# Patient Record
Sex: Male | Born: 1940
Health system: Southern US, Community
[De-identification: ages and names within clinical notes are randomized; demographics above are authoritative.]

## PROBLEM LIST (undated history)

## (undated) DIAGNOSIS — I10 Essential (primary) hypertension: Secondary | ICD-10-CM

## (undated) HISTORY — DX: Essential (primary) hypertension: I10

## (undated) HISTORY — PX: ROTATOR CUFF REPAIR: SHX139

## (undated) HISTORY — PX: CYST EXCISION: SHX5701

## (undated) HISTORY — PX: DENTAL SURGERY: SHX609

## (undated) HISTORY — PX: UMBILICAL HERNIA REPAIR: SHX196

---

## 2009-02-03 ENCOUNTER — Encounter: Admission: RE | Admit: 2009-02-03 | Discharge: 2009-02-03 | Payer: Self-pay | Admitting: Family Medicine

## 2010-01-20 IMAGING — US US AORTA SCREENING (MEDICARE)
1 series · 14 of 22 positions shown · non-contrast
Comparison: None

CLINICAL DATA: Family history of abdominal aortic aneurysm

ABDOMINAL AORTA SCREENING ULTRASOUND
TECHNIQUE: Ultrasound examination of the abdominal aorta was
performed as a screening evaluation for abdominal aortic aneurysm.
The proximal iliac arteries were also evaluated bilaterally.

[Series 1: us aorta screening (medicare) · 14 of 22 slices shown]
[im 1/22]
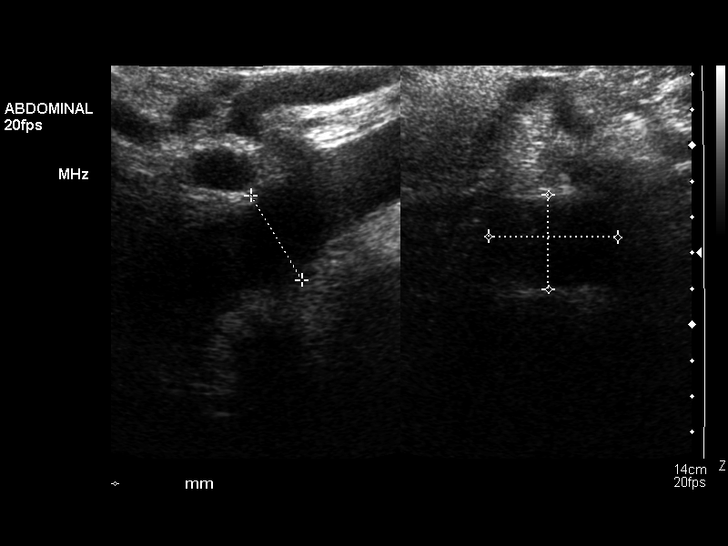
[im 3/22]
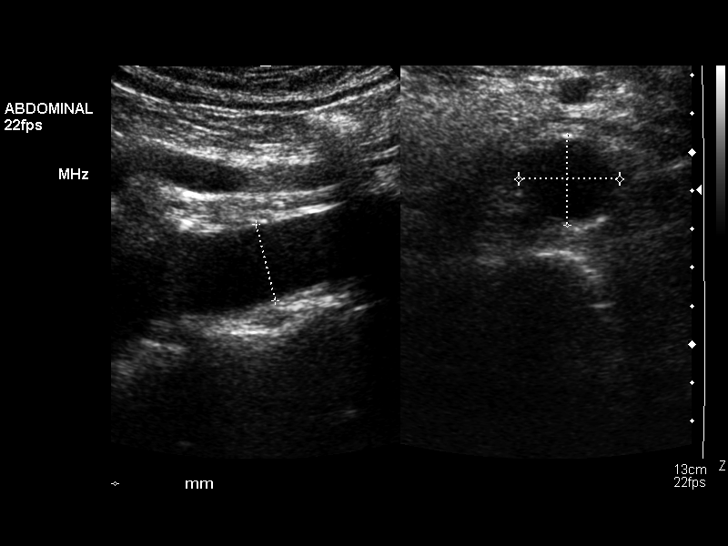
[im 4/22]
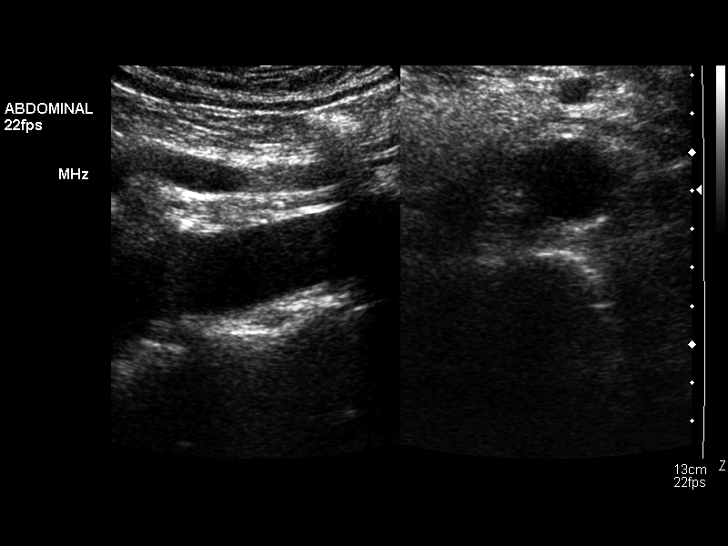
[im 6/22]
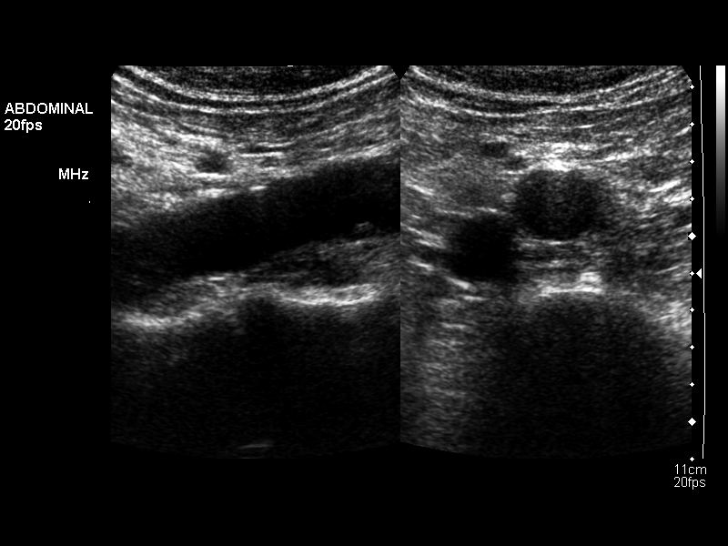
[im 8/22]
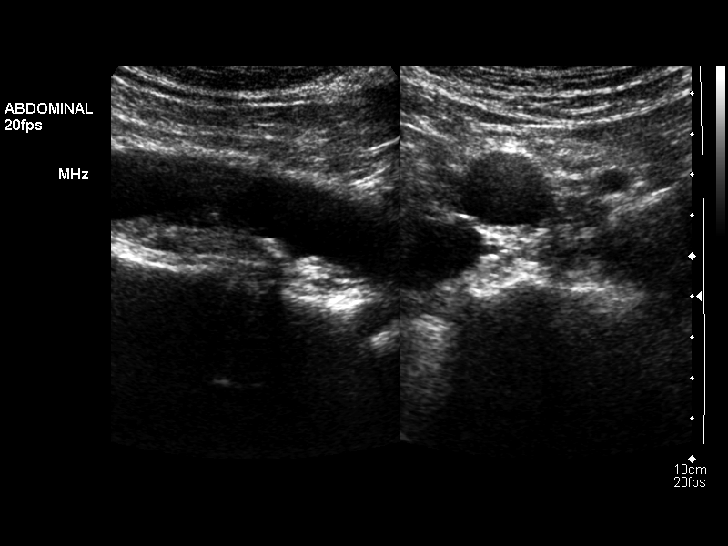
[im 9/22]
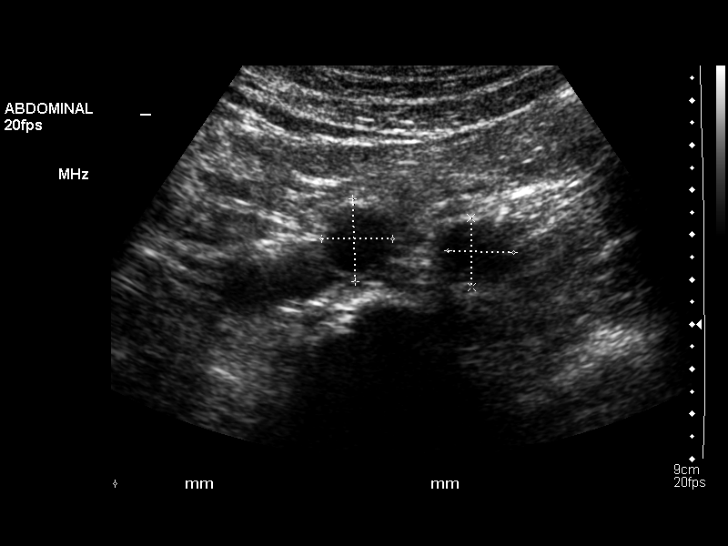
[im 11/22]
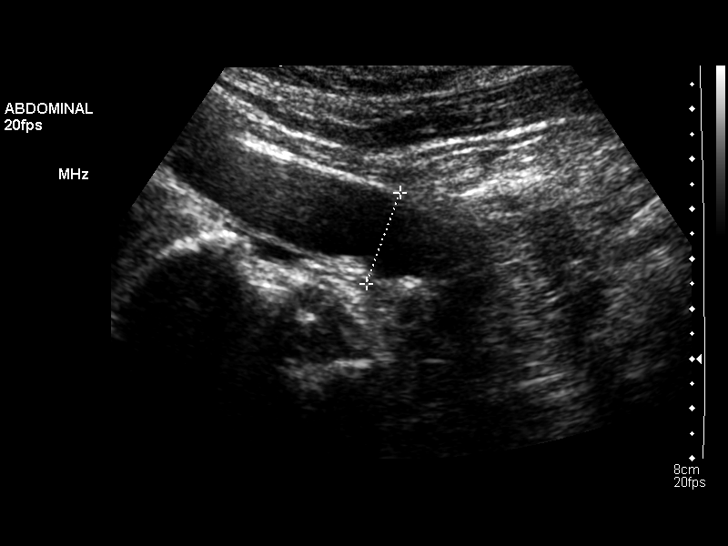
[im 12/22]
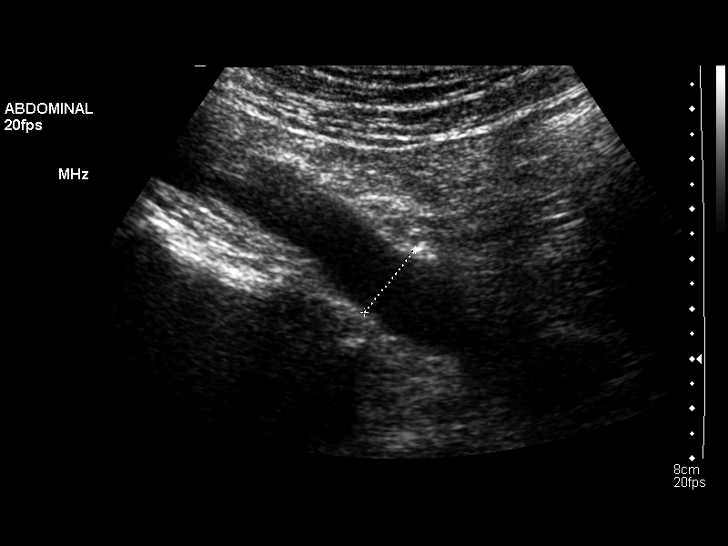
[im 14/22]
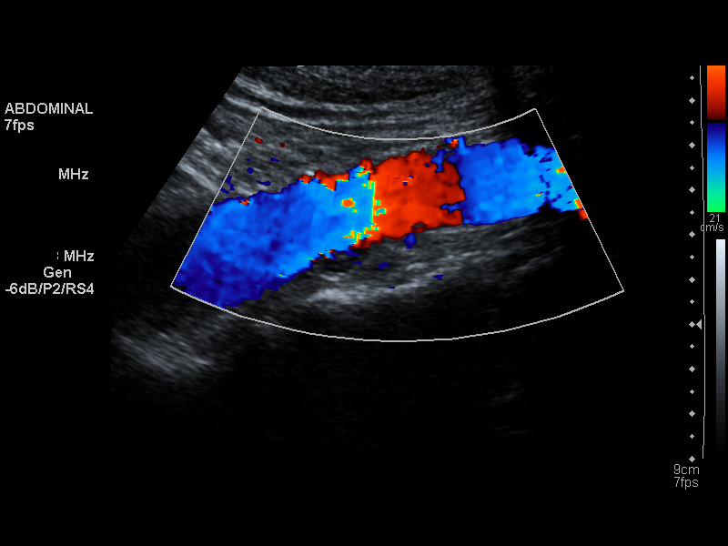
[im 15/22]
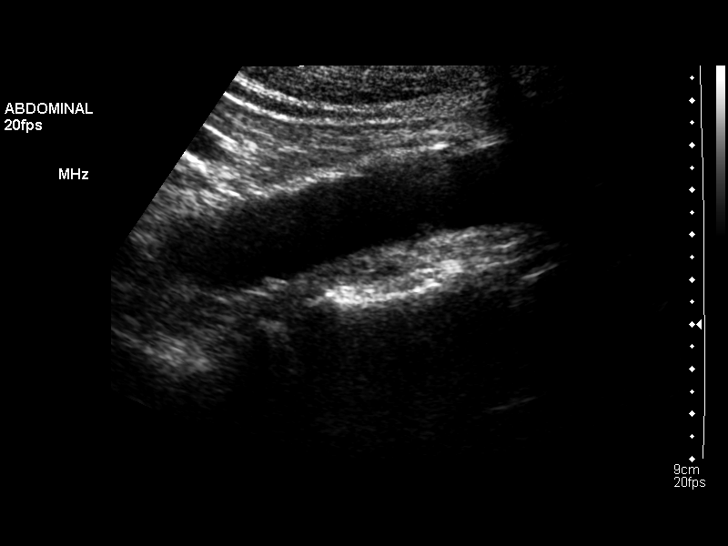
[im 17/22]
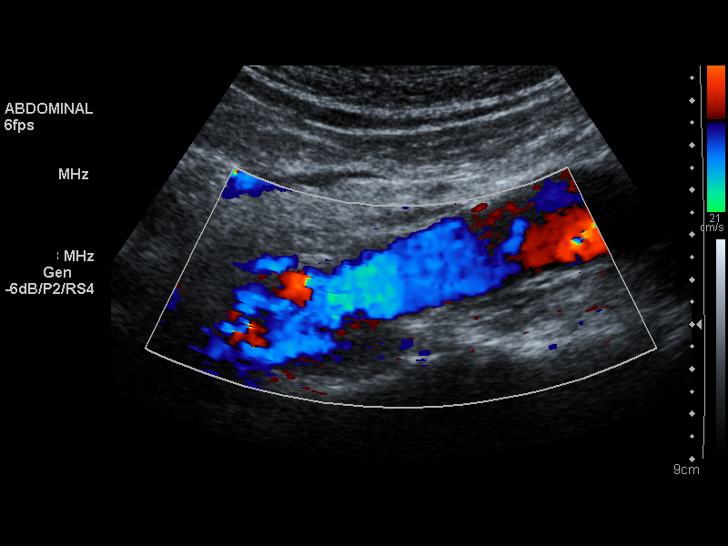
[im 19/22]
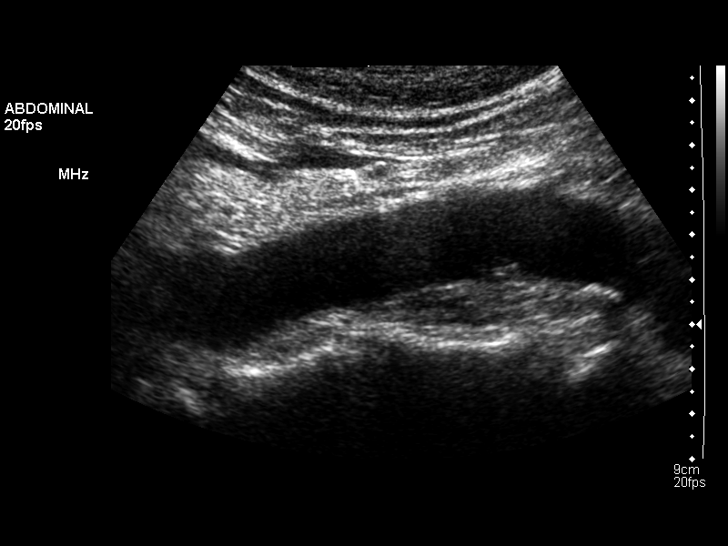
[im 20/22]
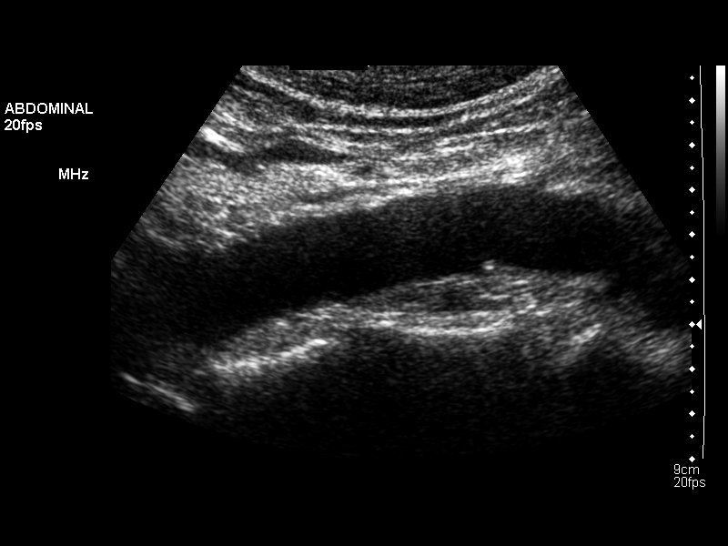
[im 22/22]
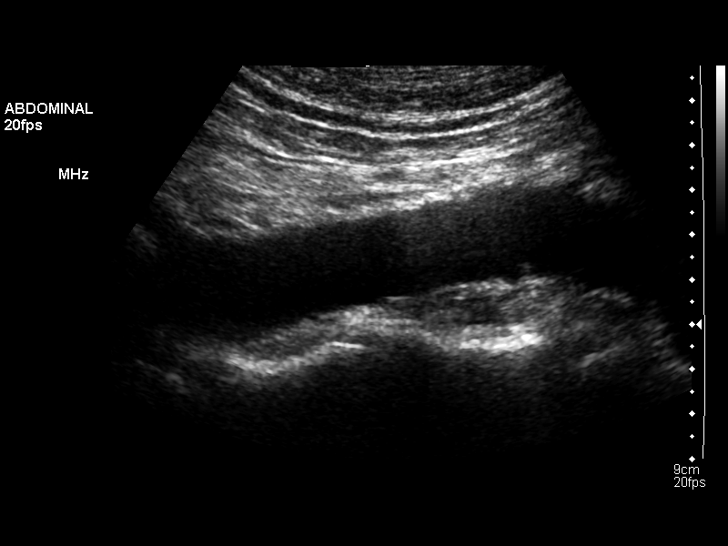

[14 of 22 positions shown; findings below may reference images not displayed]

FINDINGS: No definite focal aneurysm is seen.  The abdominal aorta
is fusiformly prominent.  Proximally the maximum diameter is 3.6 x
2.9 cm, but in the midportion the abdominal aorta measures 2.6 x
2.3 cm.  There are atherosclerotic changes with some irregularity
of the contour of the abdominal aorta.  The common iliac arteries
are prominent as well with the right measuring 23 mm proximally and
the left measuring 16 mm.

IVC is visualized.  No hydronephrosis is seen.
IMPRESSION: No definite aneurysm although the abdominal aorta is fusiformly
ectatic with atherosclerotic change.  The proximal common iliac
arteries are prominent, right greater than left.

## 2013-10-07 ENCOUNTER — Encounter: Payer: Self-pay | Admitting: Gastroenterology

## 2014-09-02 ENCOUNTER — Encounter: Payer: Self-pay | Admitting: Gastroenterology

## 2015-01-05 ENCOUNTER — Encounter: Payer: Self-pay | Admitting: Gastroenterology

## 2015-02-27 ENCOUNTER — Telehealth: Payer: Self-pay | Admitting: Internal Medicine

## 2015-02-27 NOTE — Telephone Encounter (Signed)
A user error has taken place.

## 2015-03-28 ENCOUNTER — Encounter: Payer: Self-pay | Admitting: Family Medicine

## 2017-12-07 ENCOUNTER — Encounter (HOSPITAL_COMMUNITY): Payer: Self-pay | Admitting: *Deleted

## 2017-12-07 ENCOUNTER — Emergency Department (HOSPITAL_COMMUNITY)
Admission: EM | Admit: 2017-12-07 | Discharge: 2017-12-07 | Disposition: A | Discharge status: Home / Self Care | Payer: Medicare Other | Attending: Emergency Medicine | Admitting: Emergency Medicine

## 2017-12-07 ENCOUNTER — Other Ambulatory Visit: Payer: Self-pay

## 2017-12-07 DIAGNOSIS — S40862A Insect bite (nonvenomous) of left upper arm, initial encounter: Secondary | ICD-10-CM | POA: Insufficient documentation

## 2017-12-07 DIAGNOSIS — W57XXXA Bitten or stung by nonvenomous insect and other nonvenomous arthropods, initial encounter: Secondary | ICD-10-CM | POA: Diagnosis not present

## 2017-12-07 DIAGNOSIS — Y939 Activity, unspecified: Secondary | ICD-10-CM | POA: Diagnosis not present

## 2017-12-07 DIAGNOSIS — Y998 Other external cause status: Secondary | ICD-10-CM | POA: Diagnosis not present

## 2017-12-07 DIAGNOSIS — Y929 Unspecified place or not applicable: Secondary | ICD-10-CM | POA: Insufficient documentation

## 2017-12-07 DIAGNOSIS — Z5321 Procedure and treatment not carried out due to patient leaving prior to being seen by health care provider: Secondary | ICD-10-CM | POA: Insufficient documentation

## 2017-12-07 NOTE — ED Triage Notes (Signed)
Pt not in [facility] when called

## 2017-12-07 NOTE — ED Triage Notes (Signed)
Pt c/o tick bite to left axilla about a week ago. Pt reports he didn't remove the tick until 2 days ago. Redness around tick bite. Denies fever.

## 2017-12-08 NOTE — ED Notes (Signed)
Follow up call made no answer  12/08/17  1127  s Elisah Parmer rn

## 2019-02-02 DIAGNOSIS — I10 Essential (primary) hypertension: Secondary | ICD-10-CM | POA: Diagnosis not present

## 2019-02-02 DIAGNOSIS — E782 Mixed hyperlipidemia: Secondary | ICD-10-CM | POA: Diagnosis not present

## 2019-02-04 DIAGNOSIS — M19042 Primary osteoarthritis, left hand: Secondary | ICD-10-CM | POA: Diagnosis not present

## 2019-02-04 DIAGNOSIS — Z86010 Personal history of colonic polyps: Secondary | ICD-10-CM | POA: Diagnosis not present

## 2019-02-04 DIAGNOSIS — I10 Essential (primary) hypertension: Secondary | ICD-10-CM | POA: Diagnosis not present

## 2019-02-04 DIAGNOSIS — E782 Mixed hyperlipidemia: Secondary | ICD-10-CM | POA: Diagnosis not present

## 2019-02-11 ENCOUNTER — Other Ambulatory Visit: Payer: Self-pay | Admitting: Internal Medicine

## 2019-02-11 DIAGNOSIS — Z20822 Contact with and (suspected) exposure to covid-19: Secondary | ICD-10-CM

## 2019-02-13 LAB — NOVEL CORONAVIRUS, NAA: SARS-CoV-2, NAA: NOT DETECTED

## 2019-03-30 DIAGNOSIS — R42 Dizziness and giddiness: Secondary | ICD-10-CM | POA: Diagnosis not present

## 2019-04-02 ENCOUNTER — Other Ambulatory Visit: Payer: Self-pay

## 2019-04-02 DIAGNOSIS — Z20822 Contact with and (suspected) exposure to covid-19: Secondary | ICD-10-CM

## 2019-04-03 LAB — NOVEL CORONAVIRUS, NAA: SARS-CoV-2, NAA: NOT DETECTED

## 2019-04-06 DIAGNOSIS — R011 Cardiac murmur, unspecified: Secondary | ICD-10-CM | POA: Diagnosis not present

## 2019-04-06 DIAGNOSIS — R42 Dizziness and giddiness: Secondary | ICD-10-CM | POA: Diagnosis not present

## 2019-05-06 ENCOUNTER — Encounter: Payer: Self-pay | Admitting: Cardiology

## 2019-05-06 ENCOUNTER — Other Ambulatory Visit: Payer: Self-pay

## 2019-05-06 ENCOUNTER — Ambulatory Visit: Payer: Medicare Other | Admitting: Cardiology

## 2019-05-06 VITALS — BP 136/82 | HR 71 | Ht 69.0 in | Wt 181.6 lb

## 2019-05-06 DIAGNOSIS — I10 Essential (primary) hypertension: Secondary | ICD-10-CM

## 2019-05-06 DIAGNOSIS — R011 Cardiac murmur, unspecified: Secondary | ICD-10-CM | POA: Diagnosis not present

## 2019-05-06 DIAGNOSIS — Z7189 Other specified counseling: Secondary | ICD-10-CM

## 2019-05-06 NOTE — Patient Instructions (Signed)
Medication Instructions:  Your Physician recommend you continue on your current medication as directed.    *If you need a refill on your cardiac medications before your next appointment, please call your pharmacy*  Lab Work: None  Testing/Procedures: Your physician has requested that you have an echocardiogram. Echocardiography is a painless test that uses sound waves to create images of your heart. It provides your doctor with information about the size and shape of your heart and how well your heart's chambers and valves are working. This procedure takes approximately one hour. There are no restrictions for this procedure. [place] [address]   Follow-Up: At [facility], you and your health needs are our priority.  As part of our continuing mission to provide you with exceptional heart care, we have created designated Provider Care Teams.  These Care Teams include your primary Cardiologist (physician) and Advanced Practice Providers (APPs -  Physician Assistants and Nurse Practitioners) who all work together to provide you with the care you need, when you need it.  Your next appointment:   12 months  The format for your next appointment:   In Person  Provider:   Buford Dresser, MD

## 2019-05-06 NOTE — Progress Notes (Signed)
Cardiology Office Note:    Date:  05/06/2019   ID:  Dean Kaufman, DOB 05-30-1941, MRN [id number]  PCP:  Celene Squibb, MD  Cardiologist:  Buford Dresser, MD  Referring MD: Celene Squibb, MD   CC: new patient evaluation for heart murmur.  History of Present Illness:    Dean Kaufman is a 78 y.o. male with a hx of hypertension who is seen as a new consult at the request of Celene Squibb, MD for the evaluation and management of heart murmur. Unfortunately the records for the referral were unable to be downloaded in our system, so no records available.  Patient history: Reports Echo was done 9-10 years ago, saw a cardiologist over 10 years ago on [place] but none since. Was told nothing to worry about and to come back in 10 years.  Cardiovascular risk factors: Prior clinical ASCVD: none Comorbid conditions: hypertension (>10 years) on losartan. Denies hyperlipidemia, diabetes, chronic kidney disease:  Metabolic syndrome/Obesity: no, BMI 26 Chronic inflammatory conditions: none Tobacco use history: former, quit 1972. Smoked briefly in high school and in the [employer] Alcohol: one glass of wine every other day Family history: mother had CHF for about 2 years before she died, deceased age 22. Father had aneurysm vs. MI, died age 59, was a smoker. Mat Gma deceased age 84, unknown. Mat Gpa died age 23, MI. Teena Irani died age 58 of Hodgkins. Gena Fray died age 73, unknown cause. Two sisters, one deceased age 15 from colon cancer, other sister almost 66 with terminal lung cancer. Children are healthy. Prior cardiac testing and/or incidental findings on other testing (ie coronary calcium): none. AAA Exercise level: very active. Walks 3 miles/day every day without issue. Current diet: lost 30 lbs intentionally about a year ago, tries to eat healthy  Denies chest pain, shortness of breath at rest or with normal exertion. No PND, orthopnea, LE edema or unexpected weight gain. No syncope or  palpitations.  Takes BP at home, notes that occasionally there is a pause between beats. Had PVCs on ECG today. 130-135/70 BP at home when he takes losartan, if he rarely missed a dose BP is more 150s/90s.  Past Medical History:  Diagnosis Date  . Hypertension     Past Surgical History:  Procedure Laterality Date  . CYST EXCISION    . DENTAL SURGERY    . ROTATOR CUFF REPAIR Left   . UMBILICAL HERNIA REPAIR      Current Medications: Current Outpatient Medications on File Prior to Visit  Medication Sig  . losartan (COZAAR) 25 MG tablet Take 1 tablet by mouth daily.  . Omega-3 Fatty Acids (FISH OIL PO) Take 1 tablet by mouth daily.  . Turmeric (QC TUMERIC COMPLEX PO) Take 1 tablet by mouth daily.   No current facility-administered medications on file prior to visit.      Allergies:   Patient has no known allergies.   Social History   Tobacco Use  . Smoking status: Former Smoker    Types: Cigarettes    Quit date: 07/01/1970    Years since quitting: 48.8  . Smokeless tobacco: Never Used  Substance Use Topics  . Alcohol use: Yes    Frequency: Never    Comment: "every now and then"  . Drug use: Never    Family History: family history is not on file.  ROS:   Please see the history of present illness.  Additional pertinent ROS: Constitutional: Negative for chills, fever,  night sweats, unintentional weight loss  HENT: Negative for ear pain and hearing loss.   Eyes: Negative for loss of vision and eye pain.  Respiratory: Negative for cough, sputum, wheezing.   Cardiovascular: See HPI. Gastrointestinal: Negative for abdominal pain, melena, and hematochezia.  Genitourinary: Negative for dysuria and hematuria.  Musculoskeletal: Negative for falls and myalgias.  Skin: Negative for itching and rash.  Neurological: Negative for focal weakness, focal sensory changes and loss of consciousness.  Endo/Heme/Allergies: Does not bruise/bleed easily.     EKGs/Labs/Other Studies  Reviewed:    The following studies were reviewed today: None available  EKG:  EKG is personally reviewed.  The ekg ordered today demonstrates sinus rhythm with first degree AV block, occasional PVCs  Recent Labs: No results found for requested labs within last 8760 hours.  Recent Lipid Panel No results found for: CHOL, TRIG, HDL, CHOLHDL, VLDL, LDLCALC, LDLDIRECT  Physical Exam:    VS:  BP 136/82   Pulse 71   Ht 5\' 9"  (1.753 m)   Wt 181 lb 9.6 oz (82.4 kg)   SpO2 98%   BMI 26.82 kg/m     Wt Readings from Last 3 Encounters:  05/06/19 181 lb 9.6 oz (82.4 kg)  12/07/17 170 lb (77.1 kg)    GEN: Well nourished, well developed in no acute distress HEENT: Normal, moist mucous membranes NECK: No JVD CARDIAC: regular rhythm, normal S1 and S2, no rubs or gallops. Harsh late systolic 3/6 murmur at LLSB and apex. No click appreciated. VASCULAR: Radial and DP pulses 2+ bilaterally. No carotid bruits RESPIRATORY:  Clear to auscultation without rales, wheezing or rhonchi  ABDOMEN: Soft, non-tender, non-distended MUSCULOSKELETAL:  Ambulates independently SKIN: Warm and dry, no edema NEUROLOGIC:  Alert and oriented x 3. No focal neuro deficits noted. PSYCHIATRIC:  Normal affect    ASSESSMENT:    1. Murmur   2. Essential hypertension   3. Cardiac risk counseling   4. Counseling on health promotion and disease prevention    PLAN:    Murmur: no click appreciated, but location, late systolic timing sounds consistent with mitral valve prolapse. -given frequent PVCs, will assess to determine if this is bileaflet prolapse -if bileaflet prolapse present, would get monitor to determine PVC burden/exclude NSVT for risk of SCD.  -if unclear risk, will consider cardiac MRI  Hypertension: near goal today, on long term losartan. Continue.  Cardiac risk counseling and prevention recommendations: -recommend heart healthy/Mediterranean diet, with whole grains, fruits, vegetable, fish, lean  meats, nuts, and olive oil. Limit salt. -recommend moderate walking, 3-5 times/week for 30-50 minutes each session. Aim for at least 150 minutes.week. Goal should be pace of 3 miles/hours, or walking 1.5 miles in 30 minutes -recommend avoidance of tobacco products. Avoid excess alcohol.  Plan for follow up: 12 mos or sooner based on results of echo.  Medication Adjustments/Labs and Tests Ordered: Current medicines are reviewed at length with the patient today.  Concerns regarding medicines are outlined above.  Orders Placed This Encounter  Procedures  . EKG 12-Lead  . ECHOCARDIOGRAM COMPLETE   No orders of the defined types were placed in this encounter.   Patient Instructions  Medication Instructions:  Your Physician recommend you continue on your current medication as directed.    *If you need a refill on your cardiac medications before your next appointment, please call your pharmacy*  Lab Work: None  Testing/Procedures: Your physician has requested that you have an echocardiogram. Echocardiography is a painless test that uses sound  waves to create images of your heart. It provides your doctor with information about the size and shape of your heart and how well your heart's chambers and valves are working. This procedure takes approximately one hour. There are no restrictions for this procedure. [place] [address]   Follow-Up: At [facility], you and your health needs are our priority.  As part of our continuing mission to provide you with exceptional heart care, we have created designated Provider Care Teams.  These Care Teams include your primary Cardiologist (physician) and Advanced Practice Providers (APPs -  Physician Assistants and Nurse Practitioners) who all work together to provide you with the care you need, when you need it.  Your next appointment:   12 months  The format for your next appointment:   In Person  Provider:   Buford Dresser,  MD     Signed, Buford Dresser, MD PhD 05/06/2019 11:31 PM    [place]

## 2019-05-18 ENCOUNTER — Other Ambulatory Visit (HOSPITAL_COMMUNITY): Payer: Medicare Other

## 2019-05-24 ENCOUNTER — Ambulatory Visit (HOSPITAL_COMMUNITY): Payer: Medicare Other | Attending: Cardiovascular Disease

## 2019-05-24 ENCOUNTER — Other Ambulatory Visit: Payer: Self-pay

## 2019-05-24 DIAGNOSIS — R011 Cardiac murmur, unspecified: Secondary | ICD-10-CM | POA: Insufficient documentation

## 2019-07-17 ENCOUNTER — Encounter (HOSPITAL_COMMUNITY): Payer: Self-pay | Admitting: *Deleted

## 2019-07-17 ENCOUNTER — Emergency Department (HOSPITAL_COMMUNITY)
Admission: EM | Admit: 2019-07-17 | Discharge: 2019-07-17 | Disposition: A | Discharge status: Home / Self Care | Payer: Medicare Other | Attending: Emergency Medicine | Admitting: Emergency Medicine

## 2019-07-17 ENCOUNTER — Other Ambulatory Visit: Payer: Self-pay

## 2019-07-17 ENCOUNTER — Emergency Department (HOSPITAL_COMMUNITY): Payer: Medicare Other

## 2019-07-17 DIAGNOSIS — Z79899 Other long term (current) drug therapy: Secondary | ICD-10-CM | POA: Insufficient documentation

## 2019-07-17 DIAGNOSIS — I10 Essential (primary) hypertension: Secondary | ICD-10-CM | POA: Diagnosis not present

## 2019-07-17 DIAGNOSIS — R079 Chest pain, unspecified: Secondary | ICD-10-CM | POA: Diagnosis not present

## 2019-07-17 DIAGNOSIS — Z87891 Personal history of nicotine dependence: Secondary | ICD-10-CM | POA: Insufficient documentation

## 2019-07-17 DIAGNOSIS — Y92009 Unspecified place in unspecified non-institutional (private) residence as the place of occurrence of the external cause: Secondary | ICD-10-CM | POA: Insufficient documentation

## 2019-07-17 DIAGNOSIS — W08XXXA Fall from other furniture, initial encounter: Secondary | ICD-10-CM | POA: Diagnosis not present

## 2019-07-17 DIAGNOSIS — Y999 Unspecified external cause status: Secondary | ICD-10-CM | POA: Diagnosis not present

## 2019-07-17 DIAGNOSIS — S299XXA Unspecified injury of thorax, initial encounter: Secondary | ICD-10-CM | POA: Diagnosis present

## 2019-07-17 DIAGNOSIS — Y93E9 Activity, other interior property and clothing maintenance: Secondary | ICD-10-CM | POA: Insufficient documentation

## 2019-07-17 DIAGNOSIS — S2232XA Fracture of one rib, left side, initial encounter for closed fracture: Secondary | ICD-10-CM | POA: Insufficient documentation

## 2019-07-17 LAB — BASIC METABOLIC PANEL
Anion gap: 8 (ref 5–15)
BUN: 19 mg/dL (ref 8–23)
CO2: 27 mmol/L (ref 22–32)
Calcium: 10 mg/dL (ref 8.9–10.3)
Chloride: 105 mmol/L (ref 98–111)
Creatinine, Ser: 0.98 mg/dL (ref 0.61–1.24)
GFR calc Af Amer: >60 mL/min (ref >60)
GFR calc non Af Amer: >60 mL/min (ref >60)
Glucose, Bld: 104 mg/dL — ABNORMAL HIGH (ref 70–99)
Potassium: 4.3 mmol/L (ref 3.5–5.1)
Sodium: 140 mmol/L (ref 135–145)

## 2019-07-17 LAB — CBC
HCT: 46.2 % (ref 39.0–52.0)
Hemoglobin: 15.1 g/dL (ref 13.0–17.0)
MCH: 31.3 pg (ref 26.0–34.0)
MCHC: 32.7 g/dL (ref 30.0–36.0)
MCV: 95.7 fL (ref 80.0–100.0)
Platelets: 178 10*3/uL (ref 150–400)
RBC: 4.83 MIL/uL (ref 4.22–5.81)
RDW: 13.2 % (ref 11.5–15.5)
WBC: 7.3 10*3/uL (ref 4.0–10.5)
nRBC: 0 % (ref 0.0–0.2)

## 2019-07-17 LAB — TROPONIN I (HIGH SENSITIVITY): Troponin I (High Sensitivity): 9 ng/L (ref <18)

## 2019-07-17 MED ORDER — ACETAMINOPHEN 500 MG PO TABS
1000.0000 mg | ORAL_TABLET | Freq: Once | ORAL | Status: AC
  Administered 2019-07-17: 1000 mg via ORAL
  Filled 2019-07-17: qty 2

## 2019-07-17 NOTE — ED Provider Notes (Signed)
[facility] EMERGENCY DEPARTMENT Provider Note   CSN: [id number] Arrival date & time: 07/17/19  [id number]     History Chief Complaint  Patient presents with  . Chest Pain    Dean Kaufman is a 79 y.o. male.  HPI Dean Kaufman is a 79 y/o male with PMH of HTN who presents today for new onset chest pain. It began this morning when he woke up. The pain is located in the left side of his chest, intermittent, tender to palpation, worse with sneezing, deep breathing, laughing, or coughing. Certain positions also seem to bring about the pain. It resolves on its own when he finds a comfortable position. He has never had anything like this before. About 3 days ago he was installing new flooring in his home and moving furniture around to do so. He was having to climb over a couch and he did fall onto the left side of his chest but did not experience any pain at that time.     Past Medical History:  Diagnosis Date  . Hypertension     Patient Active Problem List   Diagnosis Date Noted  . Hypertension     Past Surgical History:  Procedure Laterality Date  . CYST EXCISION    . DENTAL SURGERY    . ROTATOR CUFF REPAIR Left   . UMBILICAL HERNIA REPAIR        No family history on file.  Social History   Tobacco Use  . Smoking status: Former Smoker    Types: Cigarettes    Quit date: 07/01/1970    Years since quitting: 49.0  . Smokeless tobacco: Never Used  Substance Use Topics  . Alcohol use: Yes    Comment: "every now and then"  . Drug use: Never    Home Medications Prior to Admission medications   Medication Sig Start Date End Date Taking? Authorizing Provider  losartan (COZAAR) 25 MG tablet Take 1 tablet by mouth daily. 02/09/19   [provider]  Omega-3 Fatty Acids (FISH OIL PO) Take 1 tablet by mouth daily.    [provider]  Turmeric (QC TUMERIC COMPLEX PO) Take 1 tablet by mouth daily.    [provider]    Allergies    Patient has no known  allergies.  Review of Systems   Review of Systems  Constitutional: Negative for activity change, chills, diaphoresis, fatigue and fever.  HENT: Negative for congestion, sinus pressure and sinus pain.   Respiratory: Negative for cough, chest tightness, shortness of breath and wheezing.   Cardiovascular: Positive for chest pain. Negative for palpitations and leg swelling.  Gastrointestinal: Negative for abdominal pain, constipation, diarrhea, nausea and vomiting.  Genitourinary: Negative for flank pain.  Musculoskeletal: Negative for back pain, joint swelling and myalgias.  Skin: Negative.     Physical Exam Updated Vital Signs BP (!) 179/93 (BP Location: Right Arm)   Pulse (!) 57   Temp 97.8 F (36.6 C) (Oral)   Resp 16   Ht 5\' 9"  (1.753 m)   Wt 81.6 kg   SpO2 99%   BMI 26.58 kg/m   Physical Exam Vitals reviewed.  Constitutional:      General: He is not in acute distress.    Appearance: He is well-developed. He is not ill-appearing.  HENT:     Head: Normocephalic and atraumatic.  Eyes:     Extraocular Movements: Extraocular movements intact.     Pupils: Pupils are equal, round, and reactive to light.  Cardiovascular:  Rate and Rhythm: Normal rate and regular rhythm.     Heart sounds: Murmur present. Systolic murmur present with a grade of 3/6.  Pulmonary:     Breath sounds: Normal breath sounds. No wheezing, rhonchi or rales.  Chest:     Chest wall: Tenderness present. No crepitus or edema.  Abdominal:     General: Bowel sounds are normal.     Palpations: Abdomen is soft. There is no mass.     Tenderness: There is no abdominal tenderness. There is no guarding.  Skin:    General: Skin is warm.     Findings: No erythema or rash.  Neurological:     General: No focal deficit present.     Mental Status: He is alert.     ED Results / Procedures / Treatments   Labs (all labs ordered are listed, but only abnormal results are displayed) Labs Reviewed  CBC  BASIC  METABOLIC PANEL  TROPONIN I (HIGH SENSITIVITY)    EKG None  Radiology DG Chest 2 View  Result Date: 07/17/2019 CLINICAL DATA:  Chest pain. EXAM: CHEST - 2 VIEW COMPARISON:  None. FINDINGS: The heart size and mediastinal contours are within normal limits. Both lungs are clear. No pneumothorax or pleural effusion is noted. The visualized skeletal structures are unremarkable. IMPRESSION: No active cardiopulmonary disease. Electronically Signed   By: Marijo Conception M.D.   On: 07/17/2019 10:56    Procedures Procedures (including critical care time)  Medications Ordered in ED Medications  acetaminophen (TYLENOL) tablet 1,000 mg (has no administration in time range)    ED Course  I have reviewed the triage vital signs and the nursing notes.  Pertinent labs & imaging results that were available during my care of the patient were reviewed by me and considered in my medical decision making (see chart for details).  Dean Kaufman is a 79y/o male with PMH of HTN presenting today for chest pain following a fall. His workup was significant for normal CBC, CMP, and a one time Troponin. His chest x-ray was negative for any acute abnormalities and showed no pneumothorax or obvious rib fracture. He most likely has an occult rib fracture as he is tender to palpation and his chest pain can be reproduced on exam with deep breathing. He has a systolic murmur which is a 3/6 on my exam and confirmed mitral valve regurgitation on echocardiogram which was reviewed today. His lab workup was significant for a negative Troponin and normal CBC and BMP. Given his history and presentation is consistent with atypical chest pain with negative troponin and stable vital signs he is stable for discharge with follow up with his PCP. Most likely rib fracture. He was instructed to use the incentive spirometer at home he was given here and to rotate Tylenol with Advil for the next 3-5 days, then take as needed.   MDM  Rules/Calculators/A&P                     Dean Kaufman was evaluated in Emergency Department on 07/17/2019 for the symptoms described in the history of present illness. He was evaluated in the context of the global COVID-19 pandemic, which necessitated consideration that the patient might be at risk for infection with the SARS-CoV-2 virus that causes COVID-19. Institutional protocols and algorithms that pertain to the evaluation of patients at risk for COVID-19 are in a state of rapid change based on information released by regulatory bodies including the CDC and federal  and state organizations. These policies and algorithms were followed during the patient's care in the ED.  Final Clinical Impression(s) / ED Diagnoses Final diagnoses:  Closed fracture of one rib of left side, initial encounter    Rx / DC Orders ED Discharge Orders         Ordered    Incentive spirometry RT     07/17/19 Pitkin, Shelba Susi, DO 07/17/19 1226    Elnora Morrison, MD 07/20/19 [id number]    Elnora Morrison, MD 07/26/19 1807

## 2019-07-17 NOTE — ED Notes (Signed)
To Rad 

## 2019-07-17 NOTE — Discharge Instructions (Addendum)
Please follow up/seek emergency care if you develop worsening pain, shortness of breath, difficulty breathing, crushing chest pain with sweating, dizziness, and/or nausea.

## 2019-07-17 NOTE — ED Triage Notes (Signed)
Pt c/o left sided chest pain that started this morning. Pt reports he fell out of the bed a few days ago and landed on his chest. Pt has small abrasion to left elbow. Denies difficulty breathing. Pt concerned he may have a "cracked rib".

## 2019-07-17 NOTE — ED Notes (Signed)
Left rib pain from fall trying leap on the bed.  This occurred on Wednesday.  Pain 2/10 at rest and 10/10 with sneeze or movement.

## 2019-07-30 ENCOUNTER — Ambulatory Visit: Payer: Medicare Other

## 2019-08-10 ENCOUNTER — Ambulatory Visit: Payer: Medicare Other

## 2019-08-30 DIAGNOSIS — Z86010 Personal history of colonic polyps: Secondary | ICD-10-CM | POA: Diagnosis not present

## 2019-08-30 DIAGNOSIS — M1812 Unilateral primary osteoarthritis of first carpometacarpal joint, left hand: Secondary | ICD-10-CM | POA: Diagnosis not present

## 2019-08-30 DIAGNOSIS — I10 Essential (primary) hypertension: Secondary | ICD-10-CM | POA: Diagnosis not present

## 2019-08-30 DIAGNOSIS — S40862A Insect bite (nonvenomous) of left upper arm, initial encounter: Secondary | ICD-10-CM | POA: Diagnosis not present

## 2019-08-30 DIAGNOSIS — Z0000 Encounter for general adult medical examination without abnormal findings: Secondary | ICD-10-CM | POA: Diagnosis not present

## 2019-08-30 DIAGNOSIS — Z6824 Body mass index (BMI) 24.0-24.9, adult: Secondary | ICD-10-CM | POA: Diagnosis not present

## 2019-09-02 DIAGNOSIS — E782 Mixed hyperlipidemia: Secondary | ICD-10-CM | POA: Diagnosis not present

## 2019-09-02 DIAGNOSIS — M19042 Primary osteoarthritis, left hand: Secondary | ICD-10-CM | POA: Diagnosis not present

## 2019-09-02 DIAGNOSIS — I10 Essential (primary) hypertension: Secondary | ICD-10-CM | POA: Diagnosis not present

## 2019-09-02 DIAGNOSIS — Z86010 Personal history of colonic polyps: Secondary | ICD-10-CM | POA: Diagnosis not present

## 2019-09-02 DIAGNOSIS — Z0001 Encounter for general adult medical examination with abnormal findings: Secondary | ICD-10-CM | POA: Diagnosis not present

## 2020-02-11 DIAGNOSIS — Z1159 Encounter for screening for other viral diseases: Secondary | ICD-10-CM | POA: Diagnosis not present

## 2020-03-08 DIAGNOSIS — I341 Nonrheumatic mitral (valve) prolapse: Secondary | ICD-10-CM | POA: Diagnosis not present

## 2020-03-08 DIAGNOSIS — Z6824 Body mass index (BMI) 24.0-24.9, adult: Secondary | ICD-10-CM | POA: Diagnosis not present

## 2020-03-08 DIAGNOSIS — I10 Essential (primary) hypertension: Secondary | ICD-10-CM | POA: Diagnosis not present

## 2020-03-08 DIAGNOSIS — Z0001 Encounter for general adult medical examination with abnormal findings: Secondary | ICD-10-CM | POA: Diagnosis not present

## 2020-03-08 DIAGNOSIS — M7052 Other bursitis of knee, left knee: Secondary | ICD-10-CM | POA: Diagnosis not present

## 2020-03-08 DIAGNOSIS — M1812 Unilateral primary osteoarthritis of first carpometacarpal joint, left hand: Secondary | ICD-10-CM | POA: Diagnosis not present

## 2020-03-14 DIAGNOSIS — I10 Essential (primary) hypertension: Secondary | ICD-10-CM | POA: Diagnosis not present

## 2020-03-14 DIAGNOSIS — Z23 Encounter for immunization: Secondary | ICD-10-CM | POA: Diagnosis not present

## 2020-03-14 DIAGNOSIS — Z86010 Personal history of colonic polyps: Secondary | ICD-10-CM | POA: Diagnosis not present

## 2020-03-14 DIAGNOSIS — M19042 Primary osteoarthritis, left hand: Secondary | ICD-10-CM | POA: Diagnosis not present

## 2020-03-14 DIAGNOSIS — E782 Mixed hyperlipidemia: Secondary | ICD-10-CM | POA: Diagnosis not present

## 2020-05-16 ENCOUNTER — Ambulatory Visit: Payer: Medicare Other | Admitting: Cardiology

## 2020-05-16 ENCOUNTER — Encounter: Payer: Self-pay | Admitting: *Deleted

## 2020-05-16 ENCOUNTER — Encounter: Payer: Self-pay | Admitting: Cardiology

## 2020-05-16 ENCOUNTER — Other Ambulatory Visit: Payer: Self-pay

## 2020-05-16 VITALS — BP 130/82 | HR 77 | Ht 69.0 in | Wt 185.2 lb

## 2020-05-16 DIAGNOSIS — Z7189 Other specified counseling: Secondary | ICD-10-CM | POA: Diagnosis not present

## 2020-05-16 DIAGNOSIS — I10 Essential (primary) hypertension: Secondary | ICD-10-CM | POA: Diagnosis not present

## 2020-05-16 DIAGNOSIS — I341 Nonrheumatic mitral (valve) prolapse: Secondary | ICD-10-CM

## 2020-05-16 DIAGNOSIS — I498 Other specified cardiac arrhythmias: Secondary | ICD-10-CM

## 2020-05-16 DIAGNOSIS — I493 Ventricular premature depolarization: Secondary | ICD-10-CM | POA: Diagnosis not present

## 2020-05-16 NOTE — Progress Notes (Signed)
Cardiology Office Note:    Date:  05/16/2020   ID:  Dean Kaufman, DOB 1941-04-02, MRN [id number]  PCP:  Celene Squibb, MD  Cardiologist:  Buford Dresser, MD  Referring MD: Celene Squibb, MD   CC: follow up  History of Present Illness:    Dean Kaufman is a 79 y.o. male with a hx of hypertension, mitral valve prolapse who is seen for follow up today. I initially met him 05/06/2019 as a new consult at the request of Celene Squibb, MD for the evaluation and management of heart murmur.   Today: Doing well overall. Asking about if he can wear out his mitral valve, we discussed today. Walking 12-20 miles/week. No symptoms. Reviewed his echo; images show very slight MVP and trivial MR.   Doing well on losartan. BP well controlled.  In ventricular bigeminy today. Asymptomatic. We discussed this, how to determine burden.  Denies chest pain, shortness of breath at rest or with normal exertion. No PND, orthopnea, LE edema or unexpected weight gain. No syncope or palpitations.  Past Medical History:  Diagnosis Date  . Hypertension     Past Surgical History:  Procedure Laterality Date  . CYST EXCISION    . DENTAL SURGERY    . ROTATOR CUFF REPAIR Left   . UMBILICAL HERNIA REPAIR      Current Medications: Current Outpatient Medications on File Prior to Visit  Medication Sig  . losartan (COZAAR) 50 MG tablet Take 50 mg by mouth daily.  . Turmeric (QC TUMERIC COMPLEX PO) Take 1 tablet by mouth daily.  . Omega-3 Fatty Acids (FISH OIL PO) Take 1 tablet by mouth daily. (Patient not taking: Reported on 05/16/2020)   No current facility-administered medications on file prior to visit.     Allergies:   Patient has no known allergies.   Social History   Tobacco Use  . Smoking status: Former Smoker    Types: Cigarettes    Quit date: 07/01/1970    Years since quitting: 49.9  . Smokeless tobacco: Never Used  Substance Use Topics  . Alcohol use: Yes    Comment: "every now and  then"  . Drug use: Never    Family History: mother had CHF for about 2 years before she died, deceased age 17. Father had aneurysm vs. MI, died age 38, was a smoker. Mat Gma deceased age 10, unknown. Mat Gpa died age 43, MI. Teena Irani died age 59 of Hodgkins. Gena Fray died age 59, unknown cause. Two sisters, one deceased age 26 from colon cancer, other sister deceased around age 68 with terminal lung cancer. Children are healthy.  ROS:   Please see the history of present illness.  Additional pertinent ROS otherwise unremarkable.     EKGs/Labs/Other Studies Reviewed:    The following studies were reviewed today: Echo 05/24/2019  1. Left ventricular ejection fraction, by visual estimation, is 60 to  65%. The left ventricle has normal function. There is no left ventricular  hypertrophy.  2. Elevated left ventricular end-diastolic pressure.  3. Left ventricular diastolic parameters are consistent with Grade I  diastolic dysfunction (impaired relaxation).  4. Global right ventricle has normal systolic function.The right  ventricular size is normal. No increase in right ventricular wall  thickness.  5. Left atrial size was normal.  6. Right atrial size was normal.  7. The mitral valve is normal in structure. Trace mitral valve  regurgitation. No evidence of mitral stenosis.  8. The tricuspid valve  is normal in structure. Tricuspid valve  regurgitation is trivial.  9. The aortic valve is tricuspid. Aortic valve regurgitation is not  visualized. No evidence of aortic valve sclerosis or stenosis.  10. The pulmonic valve was normal in structure. Pulmonic valve  regurgitation is not visualized.  11. Normal pulmonary artery systolic pressure.  12. The inferior vena cava is dilated in size with >50% respiratory  variability, suggesting right atrial pressure of 8 mmHg.   EKG:  EKG is personally reviewed.  The ekg ordered today demonstrates sinus rhythm with first degree AV block,  frequent PVCs in a pattern of bigeminy at 77 bpm  Recent Labs: 07/17/2019: BUN 19; Creatinine, Ser 0.98; Hemoglobin 15.1; Platelets 178; Potassium 4.3; Sodium 140  Recent Lipid Panel No results found for: CHOL, TRIG, HDL, CHOLHDL, VLDL, LDLCALC, LDLDIRECT  Physical Exam:    VS:  BP 130/82   Pulse 77   Ht 5' 9"  (1.753 m)   Wt 185 lb 3.2 oz (84 kg)   BMI 27.35 kg/m     Wt Readings from Last 3 Encounters:  05/16/20 185 lb 3.2 oz (84 kg)  07/17/19 180 lb (81.6 kg)  05/06/19 181 lb 9.6 oz (82.4 kg)    GEN: Well nourished, well developed in no acute distress HEENT: Normal, moist mucous membranes NECK: No JVD CARDIAC: regularly irreguly rhythm in pattern of bigemony, normal S1 and S2, no rubs or gallops. 2/6 harsh late systolic murmur at LLSB and apex. No click appreciated. VASCULAR: Radial and DP pulses 2+ bilaterally. No carotid bruits RESPIRATORY:  Clear to auscultation without rales, wheezing or rhonchi  ABDOMEN: Soft, non-tender, non-distended MUSCULOSKELETAL:  Ambulates independently SKIN: Warm and dry, no edema NEUROLOGIC:  Alert and oriented x 3. No focal neuro deficits noted. PSYCHIATRIC:  Normal affect   ASSESSMENT:    1. Ventricular bigeminy   2. Essential hypertension   3. MVP (mitral valve prolapse)   4. Cardiac risk counseling   5. PVC (premature ventricular contraction)    PLAN:    Murmur: no click appreciated, but location, late systolic timing sounds consistent with mitral valve prolapse. -echo reviewed, as above -prolapse not commented on, but on my review minimal MVP with trivial MR  PVCs, bigeminy: -he is asymptomatic.  -will get 3 day monitor to evaluate PVC burden  Hypertension: near goal today, on long term losartan. Continue.  Cardiac risk counseling and prevention recommendations: -recommend heart healthy/Mediterranean diet, with whole grains, fruits, vegetable, fish, lean meats, nuts, and olive oil. Limit salt. -recommend moderate walking, 3-5  times/week for 30-50 minutes each session. Aim for at least 150 minutes.week. Goal should be pace of 3 miles/hours, or walking 1.5 miles in 30 minutes -recommend avoidance of tobacco products. Avoid excess alcohol.  Plan for follow up: 12 mos or sooner based on results of monitor. If high PVC burden, will need to bring back to discuss next steps.  Medication Adjustments/Labs and Tests Ordered: Current medicines are reviewed at length with the patient today.  Concerns regarding medicines are outlined above.  Orders Placed This Encounter  Procedures  . LONG TERM MONITOR (3-14 DAYS)  . EKG 12-Lead   No orders of the defined types were placed in this encounter.   Patient Instructions  Medication Instructions:  Your Physician recommend you continue on your current medication as directed.    *If you need a refill on your cardiac medications before your next appointment, please call your pharmacy*   Lab Work: None  Testing/Procedures: Our physician has  recommended that you wear an 3  DAY ZIO-PATCH monitor. The Zio patch cardiac monitor continuously records heart rhythm data for up to 14 days, this is for patients being evaluated for multiple types heart rhythms. For the first 24 hours post application, please avoid getting the Zio monitor wet in the shower or by excessive sweating during exercise. After that, feel free to carry on with regular activities. Keep soaps and lotions away from the ZIO XT Patch.   Someone from our office will call to verify address and mail monitor.     Follow-Up: At [facility], you and your health needs are our priority.  As part of our continuing mission to provide you with exceptional heart care, we have created designated Provider Care Teams.  These Care Teams include your primary Cardiologist (physician) and Advanced Practice Providers (APPs -  Physician Assistants and Nurse Practitioners) who all work together to provide you with the care you need, when  you need it.  We recommend signing up for the patient portal called "MyChart".  Sign up information is provided on this After Visit Summary.  MyChart is used to connect with patients for Virtual Visits (Telemedicine).  Patients are able to view lab/test results, encounter notes, upcoming appointments, etc.  Non-urgent messages can be sent to your provider as well.   To learn more about what you can do with MyChart, go to [email or web address].    Your next appointment:   1 year(s)  The format for your next appointment:   In Person  Provider:   Buford Dresser, MD  [place] Instructions   Your physician has requested you wear your ZIO patch monitor___3____days.   This is a single patch monitor.  [employer] supplies one patch monitor per enrollment.  Additional stickers are not available.   Please do not apply patch if you will be having a Nuclear Stress Test, Echocardiogram, Cardiac CT, MRI, or Chest Xray during the time frame you would be wearing the monitor. The patch cannot be worn during these tests.  You cannot remove and re-apply the ZIO XT patch monitor.   Your ZIO patch monitor will be sent USPS Priority mail from [facility] directly to your home address. The monitor may also be mailed to a PO BOX if home delivery is not available.   It may take 3-5 days to receive your monitor after you have been enrolled.   Once you have received you monitor, please review enclosed instructions.  Your monitor has already been registered assigning a specific monitor serial # to you.   Applying the monitor   Shave hair from upper left chest.   Hold abrader disc by orange tab.  Rub abrader in 40 strokes over left upper chest as indicated in your monitor instructions.   Clean area with 4 enclosed alcohol pads .  Use all pads to assure are is cleaned thoroughly.  Let dry.   Apply patch as indicated in monitor instructions.  Patch will be place under collarbone on  left side of chest with arrow pointing upward.   Rub patch adhesive wings for 2 minutes.Remove white label marked "1".  Remove white label marked "2".  Rub patch adhesive wings for 2 additional minutes.   While looking in a mirror, press and release button in center of patch.  A small green light will flash 3-4 times .  This will be your only indicator the monitor has been turned on.     Do not shower  for the first 24 hours.  You may shower after the first 24 hours.   Press button if you feel a symptom. You will hear a small click.  Record Date, Time and Symptom in the Patient Log Book.   When you are ready to remove patch, follow instructions on last 2 pages of Patient Log Book.  Stick patch monitor onto last page of Patient Log Book.   Place Patient Log Book in [place] box.  Use locking tab on box and tape box closed securely.  The [employer] and [employer] has [employer] on it.  Please place in mailbox as soon as possible.  Your physician should have your test results approximately 7 days after the monitor has been mailed back to [facility].   Call [place] at [phone] if you have questions regarding your ZIO XT patch monitor.  Call them immediately if you see an orange light blinking on your monitor.   If your monitor falls off in less than 4 days contact our Monitor department at [phone].  If your monitor becomes loose or falls off after 4 days call [employer] at [phone] for suggestions on securing your monitor.      Signed, Buford Dresser, MD PhD 05/16/2020 1:06 PM    [place]

## 2020-05-16 NOTE — Progress Notes (Signed)
Patient ID: Dean Kaufman, male   DOB: 1941-01-18, 79 y.o.   MRN: [id number] Patient enrolled for [employer] to ship a 3 day ZIO XT long term holter monitor to his home.

## 2020-05-16 NOTE — Patient Instructions (Signed)
Medication Instructions:  Your Physician recommend you continue on your current medication as directed.    *If you need a refill on your cardiac medications before your next appointment, please call your pharmacy*   Lab Work: None  Testing/Procedures: Our physician has recommended that you wear an 3  DAY ZIO-PATCH monitor. The Zio patch cardiac monitor continuously records heart rhythm data for up to 14 days, this is for patients being evaluated for multiple types heart rhythms. For the first 24 hours post application, please avoid getting the Zio monitor wet in the shower or by excessive sweating during exercise. After that, feel free to carry on with regular activities. Keep soaps and lotions away from the ZIO XT Patch.   Someone from our office will call to verify address and mail monitor.     Follow-Up: At [facility], you and your health needs are our priority.  As part of our continuing mission to provide you with exceptional heart care, we have created designated Provider Care Teams.  These Care Teams include your primary Cardiologist (physician) and Advanced Practice Providers (APPs -  Physician Assistants and Nurse Practitioners) who all work together to provide you with the care you need, when you need it.  We recommend signing up for the patient portal called "MyChart".  Sign up information is provided on this After Visit Summary.  MyChart is used to connect with patients for Virtual Visits (Telemedicine).  Patients are able to view lab/test results, encounter notes, upcoming appointments, etc.  Non-urgent messages can be sent to your provider as well.   To learn more about what you can do with MyChart, go to [email or web address].    Your next appointment:   1 year(s)  The format for your next appointment:   In Person  Provider:   Buford Dresser, MD  [place] Instructions   Your physician has requested you wear your ZIO patch  monitor___3____days.   This is a single patch monitor.  [employer] supplies one patch monitor per enrollment.  Additional stickers are not available.   Please do not apply patch if you will be having a Nuclear Stress Test, Echocardiogram, Cardiac CT, MRI, or Chest Xray during the time frame you would be wearing the monitor. The patch cannot be worn during these tests.  You cannot remove and re-apply the ZIO XT patch monitor.   Your ZIO patch monitor will be sent USPS Priority mail from [facility] directly to your home address. The monitor may also be mailed to a PO BOX if home delivery is not available.   It may take 3-5 days to receive your monitor after you have been enrolled.   Once you have received you monitor, please review enclosed instructions.  Your monitor has already been registered assigning a specific monitor serial # to you.   Applying the monitor   Shave hair from upper left chest.   Hold abrader disc by orange tab.  Rub abrader in 40 strokes over left upper chest as indicated in your monitor instructions.   Clean area with 4 enclosed alcohol pads .  Use all pads to assure are is cleaned thoroughly.  Let dry.   Apply patch as indicated in monitor instructions.  Patch will be place under collarbone on left side of chest with arrow pointing upward.   Rub patch adhesive wings for 2 minutes.Remove white label marked "1".  Remove white label marked "2".  Rub patch adhesive wings for 2 additional minutes.  While looking in a mirror, press and release button in center of patch.  A small green light will flash 3-4 times .  This will be your only indicator the monitor has been turned on.     Do not shower for the first 24 hours.  You may shower after the first 24 hours.   Press button if you feel a symptom. You will hear a small click.  Record Date, Time and Symptom in the Patient Log Book.   When you are ready to remove patch, follow instructions on last 2 pages of Patient  Log Book.  Stick patch monitor onto last page of Patient Log Book.   Place Patient Log Book in Blue box.  Use locking tab on box and tape box closed securely.  The Orange and [employer] box has prepaid postage on it.  Please place in mailbox as soon as possible.  Your physician should have your test results approximately 7 days after the monitor has been mailed back to [employer].   Call [employer] Customer Care at [phone] if you have questions regarding your ZIO XT patch monitor.  Call them immediately if you see an orange light blinking on your monitor.   If your monitor falls off in less than 4 days contact our Monitor department at [phone].  If your monitor becomes loose or falls off after 4 days call [employer] at [phone] for suggestions on securing your monitor.     

## 2020-05-22 ENCOUNTER — Other Ambulatory Visit (INDEPENDENT_AMBULATORY_CARE_PROVIDER_SITE_OTHER): Payer: Medicare Other

## 2020-05-22 DIAGNOSIS — I498 Other specified cardiac arrhythmias: Secondary | ICD-10-CM | POA: Diagnosis not present

## 2020-05-30 DIAGNOSIS — Z1159 Encounter for screening for other viral diseases: Secondary | ICD-10-CM | POA: Diagnosis not present

## 2020-06-02 DIAGNOSIS — Z1211 Encounter for screening for malignant neoplasm of colon: Secondary | ICD-10-CM | POA: Diagnosis not present

## 2020-06-02 DIAGNOSIS — Z800 Family history of malignant neoplasm of digestive organs: Secondary | ICD-10-CM | POA: Diagnosis not present

## 2020-06-02 DIAGNOSIS — D122 Benign neoplasm of ascending colon: Secondary | ICD-10-CM | POA: Diagnosis not present

## 2020-06-02 DIAGNOSIS — D120 Benign neoplasm of cecum: Secondary | ICD-10-CM | POA: Diagnosis not present

## 2020-06-02 DIAGNOSIS — K5730 Diverticulosis of large intestine without perforation or abscess without bleeding: Secondary | ICD-10-CM | POA: Diagnosis not present

## 2020-06-07 DIAGNOSIS — D122 Benign neoplasm of ascending colon: Secondary | ICD-10-CM | POA: Diagnosis not present

## 2020-06-07 DIAGNOSIS — D120 Benign neoplasm of cecum: Secondary | ICD-10-CM | POA: Diagnosis not present

## 2020-06-09 DIAGNOSIS — I493 Ventricular premature depolarization: Secondary | ICD-10-CM | POA: Diagnosis not present

## 2020-07-02 IMAGING — DX DG CHEST 2V
2 series · 2 of 2 positions shown · non-contrast
Comparison: None.

CLINICAL DATA: Chest pain.

EXAM:
CHEST - 2 VIEW

[chest pa]
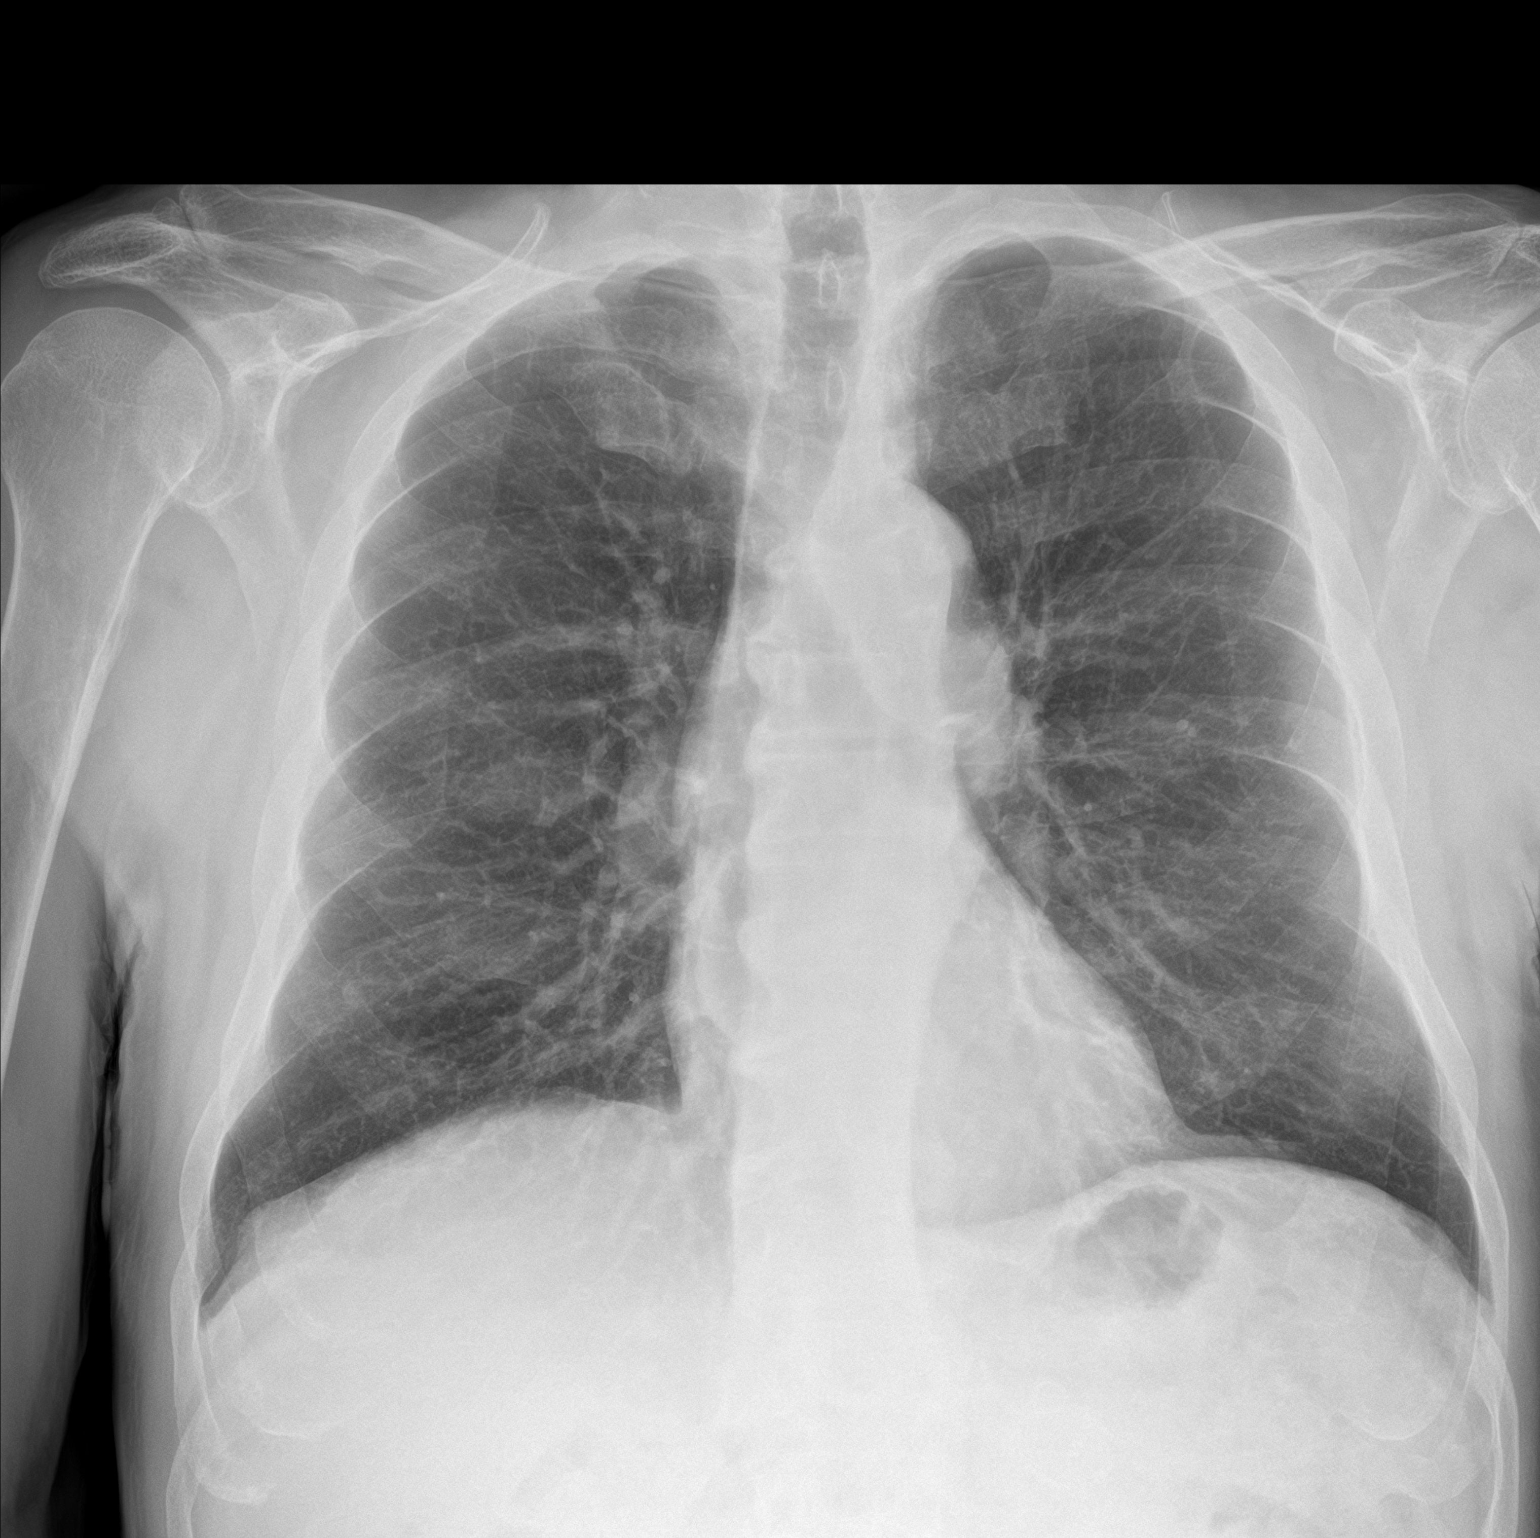

[chest lat]
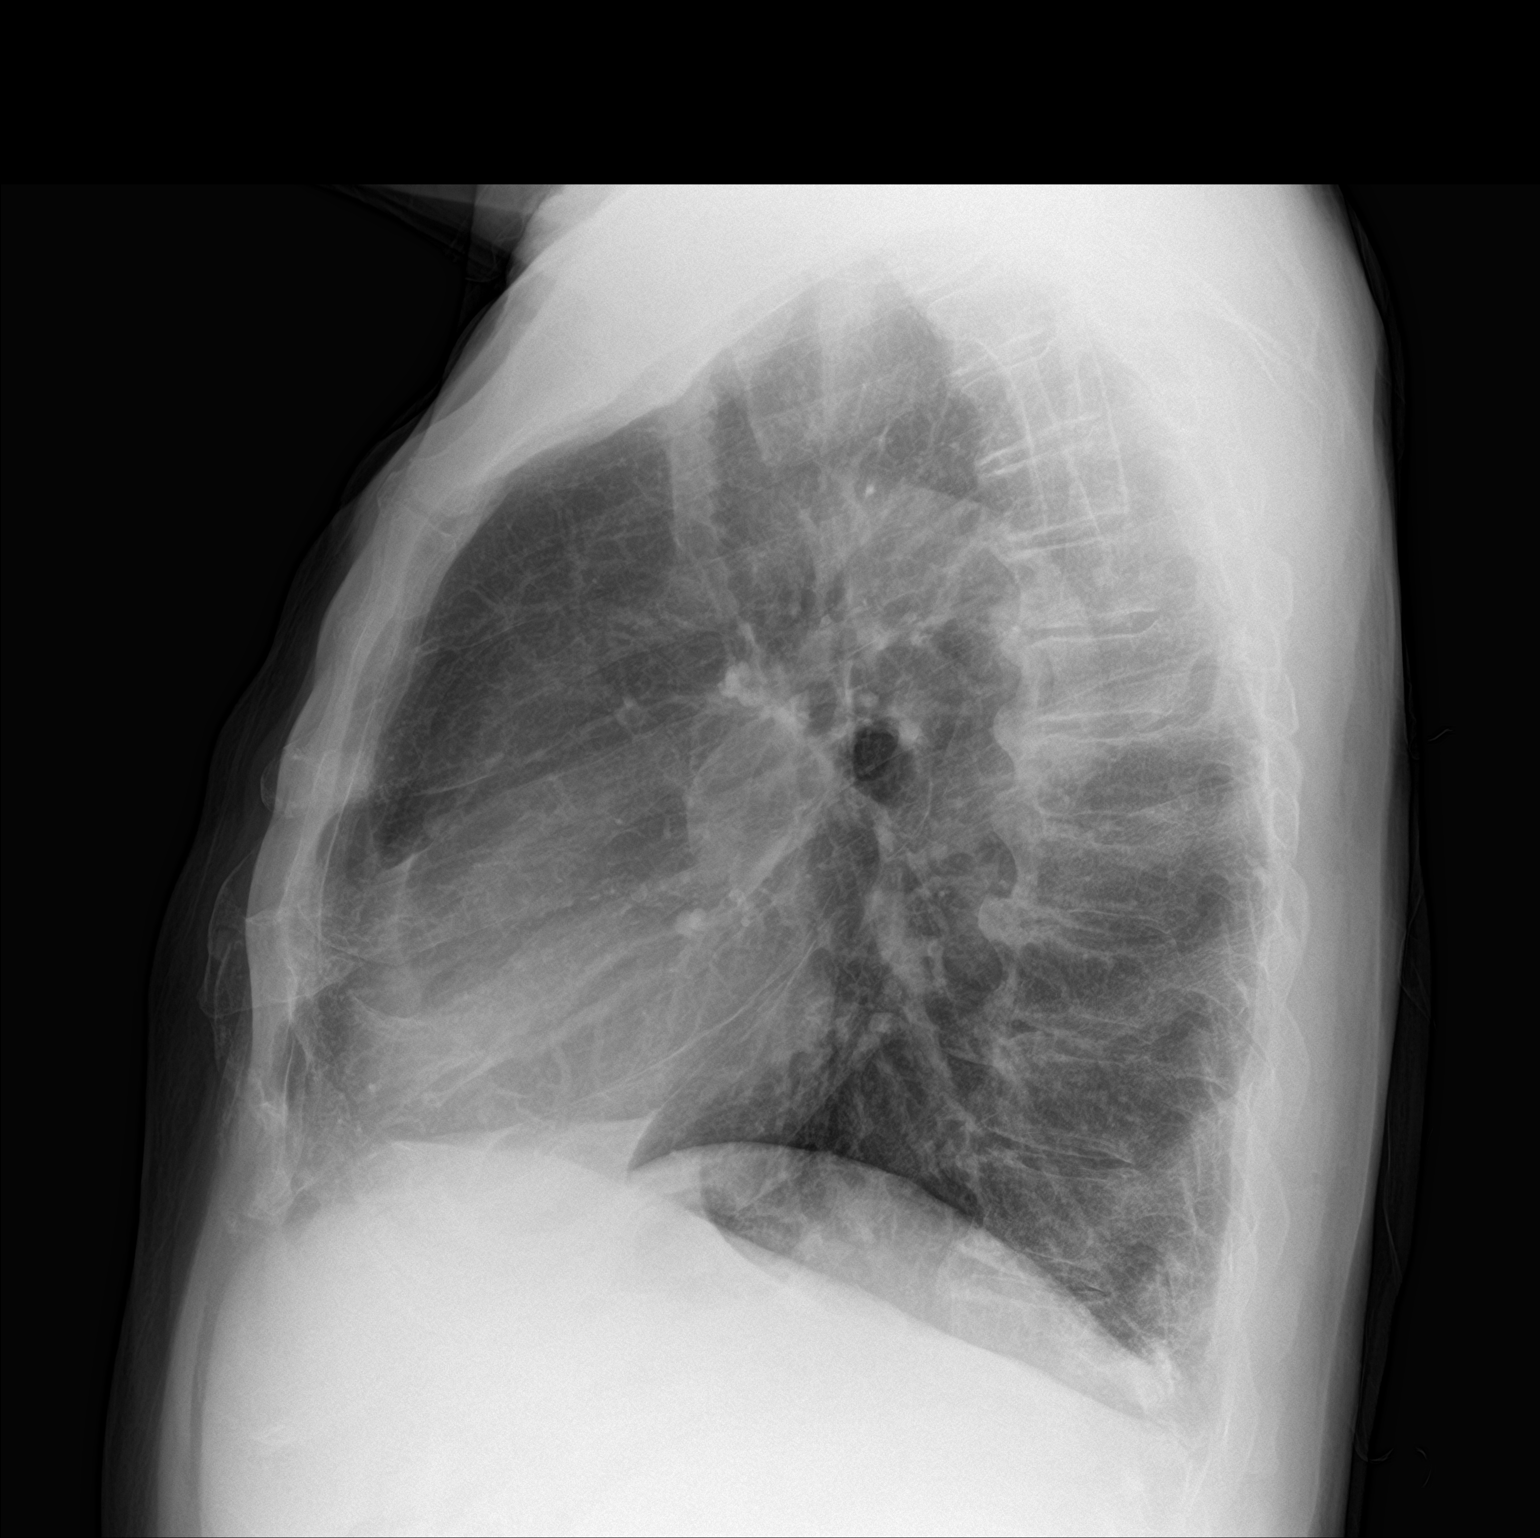

[2 of 2 positions shown; findings below may reference images not displayed]

FINDINGS: The heart size and mediastinal contours are within normal limits.
Both lungs are clear. No pneumothorax or pleural effusion is noted.
The visualized skeletal structures are unremarkable.
IMPRESSION: No active cardiopulmonary disease.

## 2020-07-10 ENCOUNTER — Encounter: Payer: Self-pay | Admitting: Cardiology

## 2020-07-10 ENCOUNTER — Ambulatory Visit: Payer: Medicare Other | Admitting: Cardiology

## 2020-07-10 ENCOUNTER — Other Ambulatory Visit: Payer: Self-pay

## 2020-07-10 VITALS — BP 144/70 | HR 56 | Ht 69.0 in | Wt 186.0 lb

## 2020-07-10 DIAGNOSIS — I10 Essential (primary) hypertension: Secondary | ICD-10-CM

## 2020-07-10 DIAGNOSIS — Z7189 Other specified counseling: Secondary | ICD-10-CM | POA: Diagnosis not present

## 2020-07-10 DIAGNOSIS — I498 Other specified cardiac arrhythmias: Secondary | ICD-10-CM | POA: Diagnosis not present

## 2020-07-10 DIAGNOSIS — I493 Ventricular premature depolarization: Secondary | ICD-10-CM

## 2020-07-10 DIAGNOSIS — R011 Cardiac murmur, unspecified: Secondary | ICD-10-CM | POA: Diagnosis not present

## 2020-07-10 MED ORDER — METOPROLOL SUCCINATE ER 25 MG PO TB24: 25.0000 mg | ORAL_TABLET | Freq: Every day | ORAL | 11 refills | Status: DC

## 2020-07-10 NOTE — Patient Instructions (Addendum)
Medication Instructions:  Start Metoprolol Succinate 25 mg (1Tablet Daily) *If you need a refill on your cardiac medications before your next appointment, please call your pharmacy*   Lab Work: No labs If you have labs (blood work) drawn today and your tests are completely normal, you will receive your results only by: Marland Kitchen MyChart Message (if you have MyChart) OR . A paper copy in the mail If you have any lab test that is abnormal or we need to change your treatment, we will call you to review the results.   Testing/Procedures: No Testing    Follow-Up: At [facility], you and your health needs are our priority.  As part of our continuing mission to provide you with exceptional heart care, we have created designated Provider Care Teams.  These Care Teams include your primary Cardiologist (physician) and Advanced Practice Providers (APPs -  Physician Assistants and Nurse Practitioners) who all work together to provide you with the care you need, when you need it.   Your next appointment:   1 year(s)  The format for your next appointment:   In Person  Provider:   Buford Dresser, MD   Other Instructions Consider [place] to monitor rhythm. You don't have to sign up for monitoring; you can save strips and send them to me in mychart.

## 2020-07-10 NOTE — Progress Notes (Signed)
Cardiology Office Note:    Date:  07/10/2020   ID:  Dean Kaufman, DOB [birth year], MRN [id number]  PCP:  Celene Squibb, MD  Cardiologist:  Buford Dresser, MD  Referring MD: Celene Squibb, MD   CC: follow up  History of Present Illness:    Dean Kaufman is a 80 y.o. male with a hx of hypertension, mitral valve prolapse who is seen for follow up today. I initially met him 05/06/2019 as a new consult at the request of Celene Squibb, MD for the evaluation and management of heart murmur. Found to have high PVC burden on monitor.  Today: Here to discuss monitor report, next steps. Monitor shows 28.9% burden PVCs. I recommended we start metoprolol succinate 25 mg daily. Wants to avoid medications, but we discussed pros/cons. Discussed options of repeating echo, trialing medications, consult with EP for another opinion. After shared decision making, willing to try metoprolol, if doesn't tolerate we will get echo and discuss options.  Denies chest pain, shortness of breath at rest or with normal exertion. No PND, orthopnea, LE edema or unexpected weight gain. No syncope or palpitations.  Past Medical History:  Diagnosis Date  . Hypertension     Past Surgical History:  Procedure Laterality Date  . CYST EXCISION    . DENTAL SURGERY    . ROTATOR CUFF REPAIR Left   . UMBILICAL HERNIA REPAIR      Current Medications: Current Outpatient Medications on File Prior to Visit  Medication Sig  . losartan (COZAAR) 50 MG tablet Take 50 mg by mouth daily.  . Omega-3 Fatty Acids (FISH OIL PO) Take 1 tablet by mouth daily.  . Turmeric (QC TUMERIC COMPLEX PO) Take 1 tablet by mouth daily.   No current facility-administered medications on file prior to visit.     Allergies:   Patient has no known allergies.   Social History   Tobacco Use  . Smoking status: Former Smoker    Types: Cigarettes    Quit date: 07/01/1970    Years since quitting: 50.0  . Smokeless tobacco: Never Used   Substance Use Topics  . Alcohol use: Yes    Comment: "every now and then"  . Drug use: Never    Family History: mother had CHF for about 2 years before she died, deceased age 16. Father had aneurysm vs. MI, died age 30, was a smoker. Mat Gma deceased age 51, unknown. Mat Gpa died age 59, MI. Teena Irani died age 5 of Hodgkins. Gena Fray died age 85, unknown cause. Two sisters, one deceased age 66 from colon cancer, other sister deceased around age 43 with terminal lung cancer. Children are healthy.  ROS:   Please see the history of present illness.  Additional pertinent ROS otherwise unremarkable.     EKGs/Labs/Other Studies Reviewed:    The following studies were reviewed today: Monitor 07/02/20 ~3 days of data recorded on Zio monitor. Patient had a min HR of 49 bpm, max HR of 169 bpm, and avg HR of 66 bpm. Predominant underlying rhythm was Sinus Rhythm. No atrial fibrillation, high degree block, or pauses noted. Isolated atrial ectopy was rare (<1%), and isolated ventricular ectopy was frequent (28.9%). There was one 4 beat episode of NSVT, max rate 124 bpm. There were 7 episodes of SVT, longest lasting 16.2 seconds with an average rate of 107 bpm.  There were 0 triggered events.   Echo 05/24/2019  1. Left ventricular ejection fraction, by visual estimation, is  60 to  65%. The left ventricle has normal function. There is no left ventricular  hypertrophy.  2. Elevated left ventricular end-diastolic pressure.  3. Left ventricular diastolic parameters are consistent with Grade I  diastolic dysfunction (impaired relaxation).  4. Global right ventricle has normal systolic function.The right  ventricular size is normal. No increase in right ventricular wall  thickness.  5. Left atrial size was normal.  6. Right atrial size was normal.  7. The mitral valve is normal in structure. Trace mitral valve  regurgitation. No evidence of mitral stenosis.  8. The tricuspid valve is normal in  structure. Tricuspid valve  regurgitation is trivial.  9. The aortic valve is tricuspid. Aortic valve regurgitation is not  visualized. No evidence of aortic valve sclerosis or stenosis.  10. The pulmonic valve was normal in structure. Pulmonic valve  regurgitation is not visualized.  11. Normal pulmonary artery systolic pressure.  12. The inferior vena cava is dilated in size with >50% respiratory  variability, suggesting right atrial pressure of 8 mmHg.   EKG:  EKG is personally reviewed.  The ekg ordered 05/16/20 demonstrates sinus rhythm with first degree AV block, frequent PVCs in a pattern of bigeminy at 77 bpm  Recent Labs: 07/17/2019: BUN 19; Creatinine, Ser 0.98; Hemoglobin 15.1; Platelets 178; Potassium 4.3; Sodium 140  Recent Lipid Panel No results found for: CHOL, TRIG, HDL, CHOLHDL, VLDL, LDLCALC, LDLDIRECT  Physical Exam:    VS:  BP (!) 144/70 (BP Location: Left Arm, Patient Position: Sitting, Cuff Size: Normal)   Pulse (!) 56   Ht 5' 9"  (1.753 m)   Wt 186 lb (84.4 kg)   BMI 27.47 kg/m     Wt Readings from Last 3 Encounters:  07/10/20 186 lb (84.4 kg)  05/16/20 185 lb 3.2 oz (84 kg)  07/17/19 180 lb (81.6 kg)    GEN: Well nourished, well developed in no acute distress HEENT: Normal, moist mucous membranes NECK: No JVD CARDIAC: regularly irregular rhythm in a pattern of bigeminy, normal S1 and S2, no rubs or gallops. 2/6 late systolic murmur. VASCULAR: Radial and DP pulses 2+ bilaterally. No carotid bruits RESPIRATORY:  Clear to auscultation without rales, wheezing or rhonchi  ABDOMEN: Soft, non-tender, non-distended MUSCULOSKELETAL:  Ambulates independently SKIN: Warm and dry, no edema NEUROLOGIC:  Alert and oriented x 3. No focal neuro deficits noted. PSYCHIATRIC:  Normal affect   ASSESSMENT:    1. Frequent PVCs   2. Ventricular bigeminy   3. Murmur   4. Essential hypertension   5. Cardiac risk counseling    PLAN:    PVCs, bigeminy: -he is  asymptomatic.  -monitor with ~30% burden. We discussed options at length today. He is understandably hesitant about medications as he feels well.  -after shared decision making, will trial low dose metoprolol succinate to see if he tolerates -if he does well, wishes to follow up in 1 year. If he does not tolerate, we will discuss next steps and repeat echo  Murmur:  -echo reviewed, as above -prolapse not commented on, but on my review minimal MVP with trivial MR  Hypertension:  -above goal today, but starting metoprolol for PVCs as above -continue losartan  Cardiac risk counseling and prevention recommendations: -recommend heart healthy/Mediterranean diet, with whole grains, fruits, vegetable, fish, lean meats, nuts, and olive oil. Limit salt. -recommend moderate walking, 3-5 times/week for 30-50 minutes each session. Aim for at least 150 minutes.week. Goal should be pace of 3 miles/hours, or walking 1.5 miles in  30 minutes -recommend avoidance of tobacco products. Avoid excess alcohol.  Plan for follow up: 12 mos or sooner as needed  Medication Adjustments/Labs and Tests Ordered: Current medicines are reviewed at length with the patient today.  Concerns regarding medicines are outlined above.  No orders of the defined types were placed in this encounter.  Meds ordered this encounter  Medications  . metoprolol succinate (TOPROL XL) 25 MG 24 hr tablet    Sig: Take 1 tablet (25 mg total) by mouth daily.    Dispense:  30 tablet    Refill:  11    Patient Instructions  Medication Instructions:  Start Metoprolol Succinate 25 mg (1Tablet Daily) *If you need a refill on your cardiac medications before your next appointment, please call your pharmacy*   Lab Work: No labs If you have labs (blood work) drawn today and your tests are completely normal, you will receive your results only by: Marland Kitchen MyChart Message (if you have MyChart) OR . A paper copy in the mail If you have any lab test  that is abnormal or we need to change your treatment, we will call you to review the results.   Testing/Procedures: No Testing    Follow-Up: At [facility], you and your health needs are our priority.  As part of our continuing mission to provide you with exceptional heart care, we have created designated Provider Care Teams.  These Care Teams include your primary Cardiologist (physician) and Advanced Practice Providers (APPs -  Physician Assistants and Nurse Practitioners) who all work together to provide you with the care you need, when you need it.   Your next appointment:   1 year(s)  The format for your next appointment:   In Person  Provider:   Buford Dresser, MD   Other Instructions Consider Antoine to monitor rhythm. You don't have to sign up for monitoring; you can save strips and send them to me in mychart.   Signed, Buford Dresser, MD PhD 07/10/2020 12:39 PM    [place]

## 2020-08-05 DIAGNOSIS — Z20822 Contact with and (suspected) exposure to covid-19: Secondary | ICD-10-CM | POA: Diagnosis not present

## 2020-09-12 DIAGNOSIS — I10 Essential (primary) hypertension: Secondary | ICD-10-CM | POA: Diagnosis not present

## 2020-09-12 DIAGNOSIS — Z6824 Body mass index (BMI) 24.0-24.9, adult: Secondary | ICD-10-CM | POA: Diagnosis not present

## 2020-09-12 DIAGNOSIS — E782 Mixed hyperlipidemia: Secondary | ICD-10-CM | POA: Diagnosis not present

## 2020-09-12 DIAGNOSIS — Z86010 Personal history of colonic polyps: Secondary | ICD-10-CM | POA: Diagnosis not present

## 2020-09-12 DIAGNOSIS — Z0001 Encounter for general adult medical examination with abnormal findings: Secondary | ICD-10-CM | POA: Diagnosis not present

## 2020-09-19 DIAGNOSIS — Z86010 Personal history of colonic polyps: Secondary | ICD-10-CM | POA: Diagnosis not present

## 2020-09-19 DIAGNOSIS — M7052 Other bursitis of knee, left knee: Secondary | ICD-10-CM | POA: Diagnosis not present

## 2020-09-19 DIAGNOSIS — Z0001 Encounter for general adult medical examination with abnormal findings: Secondary | ICD-10-CM | POA: Diagnosis not present

## 2020-09-19 DIAGNOSIS — I341 Nonrheumatic mitral (valve) prolapse: Secondary | ICD-10-CM | POA: Diagnosis not present

## 2020-09-19 DIAGNOSIS — M180 Bilateral primary osteoarthritis of first carpometacarpal joints: Secondary | ICD-10-CM | POA: Diagnosis not present

## 2020-11-28 DIAGNOSIS — Z20822 Contact with and (suspected) exposure to covid-19: Secondary | ICD-10-CM | POA: Diagnosis not present

## 2021-03-23 DIAGNOSIS — I10 Essential (primary) hypertension: Secondary | ICD-10-CM | POA: Diagnosis not present

## 2021-03-29 DIAGNOSIS — E785 Hyperlipidemia, unspecified: Secondary | ICD-10-CM | POA: Diagnosis not present

## 2021-03-29 DIAGNOSIS — I10 Essential (primary) hypertension: Secondary | ICD-10-CM | POA: Diagnosis not present

## 2021-03-29 DIAGNOSIS — I493 Ventricular premature depolarization: Secondary | ICD-10-CM | POA: Diagnosis not present

## 2021-04-18 DIAGNOSIS — Z20822 Contact with and (suspected) exposure to covid-19: Secondary | ICD-10-CM | POA: Diagnosis not present

## 2021-05-14 ENCOUNTER — Encounter (HOSPITAL_BASED_OUTPATIENT_CLINIC_OR_DEPARTMENT_OTHER): Payer: Self-pay | Admitting: Cardiology

## 2021-05-14 ENCOUNTER — Other Ambulatory Visit: Payer: Self-pay

## 2021-05-14 ENCOUNTER — Ambulatory Visit (HOSPITAL_BASED_OUTPATIENT_CLINIC_OR_DEPARTMENT_OTHER): Payer: Medicare Other | Admitting: Cardiology

## 2021-05-14 ENCOUNTER — Ambulatory Visit (INDEPENDENT_AMBULATORY_CARE_PROVIDER_SITE_OTHER): Payer: Medicare Other

## 2021-05-14 VITALS — BP 140/82 | HR 51 | Ht 69.0 in | Wt 186.2 lb

## 2021-05-14 DIAGNOSIS — Z7189 Other specified counseling: Secondary | ICD-10-CM | POA: Diagnosis not present

## 2021-05-14 DIAGNOSIS — R002 Palpitations: Secondary | ICD-10-CM

## 2021-05-14 DIAGNOSIS — I493 Ventricular premature depolarization: Secondary | ICD-10-CM

## 2021-05-14 DIAGNOSIS — I10 Essential (primary) hypertension: Secondary | ICD-10-CM | POA: Diagnosis not present

## 2021-05-14 NOTE — Patient Instructions (Signed)
Medication Instructions:  Your Physician recommend you continue on your current medication as directed.    *If you need a refill on your cardiac medications before your next appointment, please call your pharmacy*   Lab Work: None ordered today   Testing/Procedures: Our physician has recommended that you wear an  [place] monitor. The Zio patch cardiac monitor continuously records heart rhythm data for up to 14 days, this is for patients being evaluated for multiple types heart rhythms. For the first 24 hours post application, please avoid getting the Zio monitor wet in the shower or by excessive sweating during exercise. After that, feel free to carry on with regular activities. Keep soaps and lotions away from the ZIO XT Patch.      Follow-Up: At [facility], you and your health needs are our priority.  As part of our continuing mission to provide you with exceptional heart care, we have created designated Provider Care Teams.  These Care Teams include your primary Cardiologist (physician) and Advanced Practice Providers (APPs -  Physician Assistants and Nurse Practitioners) who all work together to provide you with the care you need, when you need it.  We recommend signing up for the patient portal called "MyChart".  Sign up information is provided on this After Visit Summary.  MyChart is used to connect with patients for Virtual Visits (Telemedicine).  Patients are able to view lab/test results, encounter notes, upcoming appointments, etc.  Non-urgent messages can be sent to your provider as well.   To learn more about what you can do with MyChart, go to [email or web address].    Your next appointment:   1 year(s)  The format for your next appointment:   In Person  Provider:   Buford Dresser, MD   [place] Instructions  Your physician has requested you wear a ZIO patch monitor for 7 days.  This is a single patch monitor. [employer] supplies one  patch monitor per enrollment. Additional stickers are not available. Please do not apply patch if you will be having a Nuclear Stress Test,  Echocardiogram, Cardiac CT, MRI, or Chest Xray during the period you would be wearing the  monitor. The patch cannot be worn during these tests. You cannot remove and re-apply the  ZIO XT patch monitor.  Your ZIO patch monitor will be mailed 3 day USPS to your address on file. It may take 3-5 days  to receive your monitor after you have been enrolled.  Once you have received your monitor, please review the enclosed instructions. Your monitor  has already been registered assigning a specific monitor serial # to you.  Billing and Patient Assistance Program Information  We have supplied [employer] with any of your insurance information on file for billing purposes. [employer] offers a sliding scale Patient Assistance Program for patients that do not have  insurance, or whose insurance does not completely cover the cost of the ZIO monitor.  You must apply for the Patient Assistance Program to qualify for this discounted rate.  To apply, please call [employer] at [phone], select option 4, select option 2, ask to apply for  Patient Assistance Program. Theodore Demark will ask your household income, and how many people  are in your household. They will quote your out-of-pocket cost based on that information.  [employer] will also be able to set up a 28-month, interest-free payment plan if needed.  Applying the monitor   Shave hair from upper left chest.  Hold abrader disc by  orange tab. Rub abrader in 40 strokes over the upper left chest as  indicated in your monitor instructions.  Clean area with 4 enclosed alcohol pads. Let dry.  Apply patch as indicated in monitor instructions. Patch will be placed under collarbone on left  side of chest with arrow pointing upward.  Rub patch adhesive wings for 2 minutes. Remove white label marked "1". Remove the white  label marked  "2". Rub patch adhesive wings for 2 additional minutes.  While looking in a mirror, press and release button in center of patch. A small green light will  flash 3-4 times. This will be your only indicator that the monitor has been turned on.  Do not shower for the first 24 hours. You may shower after the first 24 hours.  Press the button if you feel a symptom. You will hear a small click. Record Date, Time and  Symptom in the Patient Logbook.  When you are ready to remove the patch, follow instructions on the last 2 pages of Patient  Logbook. Stick patch monitor onto the last page of Patient Logbook.  Place Patient Logbook in the blue and white box. Use locking tab on box and tape box closed  securely. The blue and white box has prepaid postage on it. Please place it in the mailbox as  soon as possible. Your physician should have your test results approximately 7 days after the  monitor has been mailed back to [facility].  Call [place] at [phone] if you have questions regarding  your ZIO XT patch monitor. Call them immediately if you see an orange light blinking on your  monitor.  If your monitor falls off in less than 4 days, contact our Monitor department at [phone].  If your monitor becomes loose or falls off after 4 days call [employer] at [phone] for  suggestions on securing your monitor

## 2021-05-14 NOTE — Progress Notes (Signed)
Cardiology Office Note:    Date:  05/14/2021   ID:  Dean Kaufman, DOB 12-26-1940, MRN [id number]  PCP:  Celene Squibb, MD  Cardiologist:  Buford Dresser, MD  Referring MD: Celene Squibb, MD   CC: follow up  History of Present Illness:    Dean Kaufman is a 80 y.o. male with a hx of hypertension, mitral valve prolapse who is seen for follow up today. I initially met him 05/06/2019 as a new consult at the request of Celene Squibb, MD for the evaluation and management of heart murmur. Found to have high PVC burden on monitor.  Today: Overall, he feels fine. Lately his blood pressure has been stable and well controlled at home.  After he first started metoprolol at his last visit, he was not feeling well. At one time his diastolic blood pressure was 39. Per his PCP, losartan was reduced by half, and he has been feeling much better since.  He checks his pulse many times a day by palpating his neck. Within the past 5 days he has detected many PVCs. However, he also notes he may go up to a week without any palpitations.   Also, he confirms some lightheadedness when standing up too quickly after bending over.  For exercise he tries to walk 15 miles a week. Recently he has not been exercising as much, but has been staying active with making jewelry and participating in shows. He does plan to return to his baseline exercise routines.  He has successfully eliminated caffeine in his diet, but does drink decaf.  He denies any chest pain, or shortness of breath. No headaches, syncope, orthopnea, PND, lower extremity edema or exertional symptoms. No hematuria or hematochezia.   Past Medical History:  Diagnosis Date   Hypertension     Past Surgical History:  Procedure Laterality Date   CYST EXCISION     DENTAL SURGERY     ROTATOR CUFF REPAIR Left    UMBILICAL HERNIA REPAIR      Current Medications: Current Outpatient Medications on File Prior to Visit  Medication Sig    losartan (COZAAR) 50 MG tablet Take 25 mg by mouth daily.   metoprolol succinate (TOPROL XL) 25 MG 24 hr tablet Take 1 tablet (25 mg total) by mouth daily.   Omega-3 Fatty Acids (FISH OIL PO) Take 1 tablet by mouth daily.   Turmeric (QC TUMERIC COMPLEX PO) Take 1 tablet by mouth daily.   No current facility-administered medications on file prior to visit.     Allergies:   Patient has no known allergies.   Social History   Tobacco Use   Smoking status: Former    Types: Cigarettes    Quit date: 07/01/1970    Years since quitting: 50.9   Smokeless tobacco: Never  Substance Use Topics   Alcohol use: Yes    Comment: "every now and then"   Drug use: Never    Family History: mother had CHF for about 2 years before she died, deceased age 41. Father had aneurysm vs. MI, died age 48, was a smoker. Mat Gma deceased age 50, unknown. Mat Gpa died age 50, MI. Teena Irani died age 64 of Hodgkins. Gena Fray died age 31, unknown cause. Two sisters, one deceased age 69 from colon cancer, other sister deceased around age 49 with terminal lung cancer. Children are healthy.  ROS:   Please see the history of present illness.   (+) Palpitations (+) Lightheadedness Additional pertinent  ROS otherwise unremarkable.     EKGs/Labs/Other Studies Reviewed:    The following studies were reviewed today:  Monitor 07/02/20 ~3 days of data recorded on Zio monitor. Patient had a min HR of 49 bpm, max HR of 169 bpm, and avg HR of 66 bpm. Predominant underlying rhythm was Sinus Rhythm. No atrial fibrillation, high degree block, or pauses noted. Isolated atrial ectopy was rare (<1%), and isolated ventricular ectopy was frequent (28.9%). There was one 4 beat episode of NSVT, max rate 124 bpm. There were 7 episodes of SVT, longest lasting 16.2 seconds with an average rate of 107 bpm.  There were 0 triggered events.  Echo 05/24/2019  1. Left ventricular ejection fraction, by visual estimation, is 60 to  65%. The left  ventricle has normal function. There is no left ventricular  hypertrophy.   2. Elevated left ventricular end-diastolic pressure.   3. Left ventricular diastolic parameters are consistent with Grade I  diastolic dysfunction (impaired relaxation).   4. Global right ventricle has normal systolic function.The right  ventricular size is normal. No increase in right ventricular wall  thickness.   5. Left atrial size was normal.   6. Right atrial size was normal.   7. The mitral valve is normal in structure. Trace mitral valve  regurgitation. No evidence of mitral stenosis.   8. The tricuspid valve is normal in structure. Tricuspid valve  regurgitation is trivial.   9. The aortic valve is tricuspid. Aortic valve regurgitation is not  visualized. No evidence of aortic valve sclerosis or stenosis.  10. The pulmonic valve was normal in structure. Pulmonic valve  regurgitation is not visualized.  11. Normal pulmonary artery systolic pressure.  12. The inferior vena cava is dilated in size with >50% respiratory  variability, suggesting right atrial pressure of 8 mmHg.   EKG:  EKG is personally reviewed.   05/14/2021: sinus bradycardia with 1st degree AV block, 51 bpm 05/16/20: sinus rhythm with first degree AV block, frequent PVCs in a pattern of bigeminy at 77 bpm  Recent Labs: No results found for requested labs within last 8760 hours.   Recent Lipid Panel No results found for: CHOL, TRIG, HDL, CHOLHDL, VLDL, LDLCALC, LDLDIRECT  Physical Exam:    VS:  BP 140/82   Pulse (!) 51   Ht _0  (1.753 m)   Wt 186 lb 3.2 oz (84.5 kg)   SpO2 95%   BMI 27.50 kg/m     Wt Readings from Last 3 Encounters:  05/14/21 186 lb 3.2 oz (84.5 kg)  07/10/20 186 lb (84.4 kg)  05/16/20 185 lb 3.2 oz (84 kg)    GEN: Well nourished, well developed in no acute distress HEENT: Normal, moist mucous membranes NECK: No JVD CARDIAC: largely regular S1/S2 with premature beat every 10-20 beats, no rubs or  gallops. 1/6 late systolic murmur. VASCULAR: Radial and DP pulses 2+ bilaterally. No carotid bruits RESPIRATORY:  Clear to auscultation without rales, wheezing or rhonchi  ABDOMEN: Soft, non-tender, non-distended MUSCULOSKELETAL:  Ambulates independently SKIN: Warm and dry, no edema NEUROLOGIC:  Alert and oriented x 3. No focal neuro deficits noted. PSYCHIATRIC:  Normal affect   ASSESSMENT:    1. Frequent PVCs   2. Heart palpitations   3. Essential hypertension   4. Cardiac risk counseling   5. Counseling on health promotion and disease prevention     PLAN:    PVCs: -he is noting that these have increased for him in the last few weeks. -monitor  with ~30% burden.  -metoprolol helped his symptoms, but has noticed increase recently  -will repeat monitor to determine PVC burden on metoprolol. Limited options given his heart rate and blood pressure, if PVC burden high again would consider EP referral  Murmur:  -echo reviewed, as above -prolapse not commented on, but on my review minimal MVP with trivial MR  Hypertension:  -above goal today, but has had lows at home -continue losartan, had to reduce dose to 25 mg daily given low diastolic numbers  Cardiac risk counseling and prevention recommendations: -recommend heart healthy/Mediterranean diet, with whole grains, fruits, vegetable, fish, lean meats, nuts, and olive oil. Limit salt. -recommend moderate walking, 3-5 times/week for 30-50 minutes each session. Aim for at least 150 minutes.week. Goal should be pace of 3 miles/hours, or walking 1.5 miles in 30 minutes -recommend avoidance of tobacco products. Avoid excess alcohol.  Plan for follow up: 12 mos or sooner as needed  Medication Adjustments/Labs and Tests Ordered: Current medicines are reviewed at length with the patient today.  Concerns regarding medicines are outlined above.   Orders Placed This Encounter  Procedures   LONG TERM MONITOR (3-14 DAYS)   EKG 12-Lead     No orders of the defined types were placed in this encounter.  Patient Instructions  Medication Instructions:  Your Physician recommend you continue on your current medication as directed.    *If you need a refill on your cardiac medications before your next appointment, please call your pharmacy*   Lab Work: None ordered today   Testing/Procedures: Our physician has recommended that you wear an  [place] monitor. The Zio patch cardiac monitor continuously records heart rhythm data for up to 14 days, this is for patients being evaluated for multiple types heart rhythms. For the first 24 hours post application, please avoid getting the Zio monitor wet in the shower or by excessive sweating during exercise. After that, feel free to carry on with regular activities. Keep soaps and lotions away from the ZIO XT Patch.      Follow-Up: At [facility], you and your health needs are our priority.  As part of our continuing mission to provide you with exceptional heart care, we have created designated Provider Care Teams.  These Care Teams include your primary Cardiologist (physician) and Advanced Practice Providers (APPs -  Physician Assistants and Nurse Practitioners) who all work together to provide you with the care you need, when you need it.  We recommend signing up for the patient portal called "MyChart".  Sign up information is provided on this After Visit Summary.  MyChart is used to connect with patients for Virtual Visits (Telemedicine).  Patients are able to view lab/test results, encounter notes, upcoming appointments, etc.  Non-urgent messages can be sent to your provider as well.   To learn more about what you can do with MyChart, go to [email or web address].    Your next appointment:   1 year(s)  The format for your next appointment:   In Person  Provider:   Buford Dresser, MD   [place] Instructions  Your physician has requested you  wear a ZIO patch monitor for 7 days.  This is a single patch monitor. [employer] supplies one patch monitor per enrollment. Additional stickers are not available. Please do not apply patch if you will be having a Nuclear Stress Test,  Echocardiogram, Cardiac CT, MRI, or Chest Xray during the period you would be wearing the  monitor. The patch  cannot be worn during these tests. You cannot remove and re-apply the  ZIO XT patch monitor.  Your ZIO patch monitor will be mailed 3 day USPS to your address on file. It may take 3-5 days  to receive your monitor after you have been enrolled.  Once you have received your monitor, please review the enclosed instructions. Your monitor  has already been registered assigning a specific monitor serial # to you.  Billing and Patient Assistance Program Information  We have supplied [employer] with any of your insurance information on file for billing purposes. [employer] offers a sliding scale Patient Assistance Program for patients that do not have  insurance, or whose insurance does not completely cover the cost of the ZIO monitor.  You must apply for the Patient Assistance Program to qualify for this discounted rate.  To apply, please call [employer] at [phone], select option 4, select option 2, ask to apply for  Patient Assistance Program. Theodore Demark will ask your household income, and how many people  are in your household. They will quote your out-of-pocket cost based on that information.  [employer] will also be able to set up a 16-month interest-free payment plan if needed.  Applying the monitor   Shave hair from upper left chest.  Hold abrader disc by orange tab. Rub abrader in 40 strokes over the upper left chest as  indicated in your monitor instructions.  Clean area with 4 enclosed alcohol pads. Let dry.  Apply patch as indicated in monitor instructions. Patch will be placed under collarbone on left  side of chest with arrow pointing upward.  Rub patch  adhesive wings for 2 minutes. Remove white label marked "1". Remove the white  label marked "2". Rub patch adhesive wings for 2 additional minutes.  While looking in a mirror, press and release button in center of patch. A small green light will  flash 3-4 times. This will be your only indicator that the monitor has been turned on.  Do not shower for the first 24 hours. You may shower after the first 24 hours.  Press the button if you feel a symptom. You will hear a small click. Record Date, Time and  Symptom in the Patient Logbook.  When you are ready to remove the patch, follow instructions on the last 2 pages of Patient  Logbook. Stick patch monitor onto the last page of Patient Logbook.  Place Patient Logbook in the blue and white box. Use locking tab on box and tape box closed  securely. The blue and white box has prepaid postage on it. Please place it in the mailbox as  soon as possible. Your physician should have your test results approximately 7 days after the  [facility]  Call [employer] [phone] you have questions regarding  your ZIO XT patch monitor. Call them immediately if you see an orange light blinking on your  monitor.  If your monitor falls off in less than 4 days, contact our Monitor department at [phone]  If your monitor becomes loose or falls off after 4 days call [employer] at [phone]  suggestions on securing your monitor    I,Mathew Stumpf,acting as a scribe for BBuford Dresser MD.,have documented all relevant documentation on the behalf of BBuford Dresser MD,as directed by  BBuford Dresser MD while in the presence of BBuford Dresser MD.  I, BBuford Dresser MD, have reviewed all documentation for this visit. The documentation on 05/14/21  for the exam, diagnosis, procedures, and orders are all accurate and complete.   Signed, Buford Dresser, MD  PhD 05/14/2021 9:39 AM    [place]

## 2021-05-21 DIAGNOSIS — I493 Ventricular premature depolarization: Secondary | ICD-10-CM

## 2021-05-28 DIAGNOSIS — I493 Ventricular premature depolarization: Secondary | ICD-10-CM | POA: Diagnosis not present

## 2021-06-30 ENCOUNTER — Other Ambulatory Visit: Payer: Self-pay | Admitting: Cardiology

## 2021-06-30 DIAGNOSIS — I493 Ventricular premature depolarization: Secondary | ICD-10-CM

## 2021-09-24 DIAGNOSIS — I10 Essential (primary) hypertension: Secondary | ICD-10-CM | POA: Diagnosis not present

## 2021-09-27 DIAGNOSIS — I493 Ventricular premature depolarization: Secondary | ICD-10-CM | POA: Diagnosis not present

## 2021-09-27 DIAGNOSIS — R059 Cough, unspecified: Secondary | ICD-10-CM | POA: Diagnosis not present

## 2021-09-27 DIAGNOSIS — I10 Essential (primary) hypertension: Secondary | ICD-10-CM | POA: Diagnosis not present

## 2021-09-27 DIAGNOSIS — E785 Hyperlipidemia, unspecified: Secondary | ICD-10-CM | POA: Diagnosis not present

## 2021-10-15 DIAGNOSIS — L821 Other seborrheic keratosis: Secondary | ICD-10-CM | POA: Diagnosis not present

## 2021-10-15 DIAGNOSIS — C44629 Squamous cell carcinoma of skin of left upper limb, including shoulder: Secondary | ICD-10-CM | POA: Diagnosis not present

## 2021-10-15 DIAGNOSIS — D225 Melanocytic nevi of trunk: Secondary | ICD-10-CM | POA: Diagnosis not present

## 2021-10-15 DIAGNOSIS — L820 Inflamed seborrheic keratosis: Secondary | ICD-10-CM | POA: Diagnosis not present

## 2021-10-15 DIAGNOSIS — Z1283 Encounter for screening for malignant neoplasm of skin: Secondary | ICD-10-CM | POA: Diagnosis not present

## 2022-03-25 DIAGNOSIS — I10 Essential (primary) hypertension: Secondary | ICD-10-CM | POA: Diagnosis not present

## 2022-04-01 DIAGNOSIS — Z23 Encounter for immunization: Secondary | ICD-10-CM | POA: Diagnosis not present

## 2022-04-01 DIAGNOSIS — I10 Essential (primary) hypertension: Secondary | ICD-10-CM | POA: Diagnosis not present

## 2022-04-01 DIAGNOSIS — Z0000 Encounter for general adult medical examination without abnormal findings: Secondary | ICD-10-CM | POA: Diagnosis not present

## 2022-04-01 DIAGNOSIS — Z0001 Encounter for general adult medical examination with abnormal findings: Secondary | ICD-10-CM | POA: Diagnosis not present

## 2022-04-01 DIAGNOSIS — I493 Ventricular premature depolarization: Secondary | ICD-10-CM | POA: Diagnosis not present

## 2022-06-17 ENCOUNTER — Ambulatory Visit (INDEPENDENT_AMBULATORY_CARE_PROVIDER_SITE_OTHER): Payer: Medicare Other

## 2022-06-17 ENCOUNTER — Encounter: Payer: Self-pay | Admitting: Orthopedic Surgery

## 2022-06-17 ENCOUNTER — Ambulatory Visit (INDEPENDENT_AMBULATORY_CARE_PROVIDER_SITE_OTHER): Payer: Medicare Other | Admitting: Orthopedic Surgery

## 2022-06-17 VITALS — BP 160/103 | HR 67 | Ht 69.0 in | Wt 190.0 lb

## 2022-06-17 DIAGNOSIS — M25511 Pain in right shoulder: Secondary | ICD-10-CM

## 2022-06-17 DIAGNOSIS — G8929 Other chronic pain: Secondary | ICD-10-CM

## 2022-06-17 DIAGNOSIS — S46011A Strain of muscle(s) and tendon(s) of the rotator cuff of right shoulder, initial encounter: Secondary | ICD-10-CM | POA: Diagnosis not present

## 2022-06-17 NOTE — Progress Notes (Signed)
Chief Complaint  Patient presents with   Shoulder Pain    Right since Oct 39    HPI: 81 year old male ruptured his biceps tendon about 6 years ago and then in October last year he was lifting something and felt a pop in his right shoulder and then started having pain.  Although the pain is decreased to 5 out of 10 his range of motion is poor in abduction he can raise the arm if he flexes his elbow and then he has some forward elevation directly forward but in the supraspinatus empty can position has significant pain and amount of weakness  She presents for evaluation and management  Past Medical History:  Diagnosis Date   Hypertension     BP (!) 160/103   Pulse 67   Ht '5\' 9"'$  (1.753 m)   Wt 190 lb (86.2 kg)   BMI 28.06 kg/m    General appearance: Well-developed well-nourished no gross deformities  Cardiovascular normal pulse and perfusion normal color without edema  Neurologically no sensation loss or deficits or pathologic reflexes  Psychological: Awake alert and oriented x3 mood and affect normal  Skin no lacerations or ulcerations no nodularity no palpable masses, no erythema or nodularity  Musculoskeletal:   Right shoulder abduction is 45 degrees active Right shoulder forward elevation with some manipulation of the arm actively is 180 degrees but supraspinatus function is 3 if not 2 out of 5 external rotation remains intact with his arm at his side  Imaging plain films today show proximal migration of the humerus decreased humeral head acromial distance with no significant joint space narrowing  A/P  Encounter Diagnoses  Name Primary?   Chronic right shoulder pain    Traumatic complete tear of right rotator cuff, initial encounter Yes    81 year old male with history of PVCs currently controlled with metoprolol no history of any other coronary artery disease tore his biceps tendon 6 years ago had a second injury last year which was most likely a rotator cuff tear  presents with a shoulder exhibiting signs of rotator cuff induced arthropathy although no degenerative changes seen at this time.  His pain is 5 out of 10 at worst but his functional status is bothering him and he would like something done  I offered him nonoperative treatment but he said he rather have something done if possible  So I am referring him to my associate to consider reverse total shoulder

## 2022-06-26 ENCOUNTER — Other Ambulatory Visit: Payer: Self-pay | Admitting: Cardiology

## 2022-06-26 DIAGNOSIS — I493 Ventricular premature depolarization: Secondary | ICD-10-CM

## 2022-07-02 ENCOUNTER — Encounter: Payer: Self-pay | Admitting: Orthopedic Surgery

## 2022-07-02 ENCOUNTER — Ambulatory Visit: Payer: Medicare Other | Admitting: Orthopedic Surgery

## 2022-07-02 VITALS — BP 153/87 | HR 53 | Ht 69.0 in | Wt 190.0 lb

## 2022-07-02 DIAGNOSIS — M12811 Other specific arthropathies, not elsewhere classified, right shoulder: Secondary | ICD-10-CM

## 2022-07-02 NOTE — Patient Instructions (Signed)

## 2022-07-02 NOTE — Progress Notes (Signed)
New Patient Visit  Assessment: Dean Kaufman is a 82 y.o. male with the following: Right shoulder rotator cuff arthropathy  Plan: Dean Kaufman has a rotator cuff tear based on the appearance of his x-rays, with proximal humeral migration.  He remains very healthy and very active.  We did discuss possibility of proceeding with reverse shoulder arthroplasty.  The procedure was discussed, and all questions were answered.  We also discussed possibly proceeding with a steroid injection, as this could help with some of his symptoms.  He states his understanding.  He would like to try an injection, and see how he does.  Injection was completed in clinic without issues.  He will follow-up as needed.  He is aware that we would not be able to schedule surgery for at least 3 months following the injection.  Procedure note injection - Right shoulder    Verbal consent was obtained to inject the right shoulder, subacromial space Timeout was completed to confirm the site of injection.   The skin was prepped with alcohol and ethyl chloride was sprayed at the injection site.  A 21-gauge needle was used to inject 40 mg of Depo-Medrol and 1% lidocaine (3 cc) into the subacromial space of the right shoulder using a posterolateral approach.  There were no complications.  A sterile bandage was applied.    Follow-up: Return if symptoms worsen or fail to improve.  Subjective:  Chief Complaint  Patient presents with   Shoulder Pain    Rt shoulder pain, pt states he lifted a car battery Oct '22    History of Present Illness: Dean Kaufman is a 82 y.o. male who presents for evaluation of right shoulder pain.  He saw my partner Dr. Aline Brochure a couple of weeks ago.  Briefly, he has had pain and limited function of the right shoulder since October, 2022 after lifting a car battery.  Since then, he has had worsening pain.  He has recovered much of his function, but continues to have aching sensations in his  shoulder.  He is able to bring his arm straight forward, but has difficulty with abduction to the side.  He is planning on doing some work around his house, and is concerned about his current symptoms.  In addition, he is concerned that his symptoms will progress, and make further treatment even more difficult.  He he has not had an injection in his right shoulder.  He has not worked with physical therapy.   Review of Systems: No fevers or chills No numbness or tingling No chest pain No shortness of breath No bowel or bladder dysfunction No GI distress No headaches   Medical History:  Past Medical History:  Diagnosis Date   Hypertension     Past Surgical History:  Procedure Laterality Date   CYST EXCISION     DENTAL SURGERY     ROTATOR CUFF REPAIR Left    UMBILICAL HERNIA REPAIR      History reviewed. No pertinent family history. Social History   Tobacco Use   Smoking status: Former    Types: Cigarettes    Quit date: 07/01/1970    Years since quitting: 52.0   Smokeless tobacco: Never  Substance Use Topics   Alcohol use: Yes    Comment: "every now and then"   Drug use: Never    No Known Allergies  Current Meds  Medication Sig   metoprolol succinate (TOPROL-XL) 25 MG 24 hr tablet TAKE 1 TABLET(25 MG) BY MOUTH  DAILY    Objective: BP (!) 153/87   Pulse (!) 53   Ht '5\' 9"'$  (1.753 m)   Wt 190 lb (86.2 kg)   BMI 28.06 kg/m   Physical Exam:  General: Alert and oriented. and No acute distress. Gait: Normal gait.  Right shoulder with deformity at the [facility] joint, without tenderness.  There is some atrophy over the posterior aspect of the scapula.  He is able to achieve full flexion, but around 90-100 degrees, he has to adjust the position of his arm in order to get higher.  Abduction is side is limited to approximately 70 degrees.  Positive Jobe's.  Weakness in the supraspinatus.  Negative belly press.  Internal rotation of the lumbar spine.  IMAGING: I personally  reviewed images previously obtained in clinic  2 views of the right shoulder were previously obtained.  Limited visibility, but there is proximal humeral migration, with decreased acromiohumeral height distance.  No acute injuries are noted.   New Medications:  No orders of the defined types were placed in this encounter.     Mordecai Rasmussen, MD  07/02/2022 11:48 AM

## 2022-07-10 DIAGNOSIS — Z79899 Other long term (current) drug therapy: Secondary | ICD-10-CM | POA: Diagnosis not present

## 2022-07-10 DIAGNOSIS — I10 Essential (primary) hypertension: Secondary | ICD-10-CM | POA: Diagnosis not present

## 2022-08-29 ENCOUNTER — Encounter: Payer: Self-pay | Admitting: Radiology

## 2022-09-24 DIAGNOSIS — I10 Essential (primary) hypertension: Secondary | ICD-10-CM | POA: Diagnosis not present

## 2022-09-30 DIAGNOSIS — I493 Ventricular premature depolarization: Secondary | ICD-10-CM | POA: Diagnosis not present

## 2022-09-30 DIAGNOSIS — Z79899 Other long term (current) drug therapy: Secondary | ICD-10-CM | POA: Diagnosis not present

## 2022-09-30 DIAGNOSIS — E785 Hyperlipidemia, unspecified: Secondary | ICD-10-CM | POA: Diagnosis not present

## 2022-09-30 DIAGNOSIS — I10 Essential (primary) hypertension: Secondary | ICD-10-CM | POA: Diagnosis not present

## 2022-09-30 DIAGNOSIS — Z23 Encounter for immunization: Secondary | ICD-10-CM | POA: Diagnosis not present

## 2022-09-30 DIAGNOSIS — Z713 Dietary counseling and surveillance: Secondary | ICD-10-CM | POA: Diagnosis not present

## 2022-09-30 DIAGNOSIS — D696 Thrombocytopenia, unspecified: Secondary | ICD-10-CM | POA: Diagnosis not present

## 2022-11-01 NOTE — Progress Notes (Signed)
Cardiology Office Note:    Date:  11/05/2022   ID:  Dean Kaufman, DOB 11-Jun-1941, MRN [id number]  PCP:  Benita Stabile, MD  Cardiologist:  Jodelle Red, MD  Referring MD: Benita Stabile, MD   CC: follow up  History of Present Illness:    Dean Kaufman is a 82 y.o. male with a hx of hypertension, mitral valve prolapse who is seen for follow up today. I initially met him 05/06/2019 as a new consult at the request of Benita Stabile, MD for the evaluation and management of heart murmur. Found to have high PVC burden on monitor (~30% burden).  At his last appointment, his blood pressure was stable and well controlled at home. After he had first started metoprolol, he didn't feel well. At one time his diastolic blood pressure was 39. Per his PCP, losartan was reduced by half, and he reported feeling better since that change. Also reported increasingly frequent PVCs detected by palpating his neck.   We ordered a repeat cardiac monitor 05/2021 which revealed predominantly Sinus Rhythm. There were 4 runs of SVT, the longest lasting 10 beats with an avg rate of 102 bpm. Isolated atrial ectopy was rare (<1.0%). Isolated ventricular ectopy was frequent (9.7%). No VT, atrial fibrillation, high degree block, or pauses noted. There was 1 triggered event, which was sinus with PVC. There was variability with the daily PVC burden; burden higher 11/14-11/18/22, with up to 16.9% burden on 11/18. The remaining days were lower burden, down to <1% by end of study. He was contacted by our office 08/2021 to provide his monitor results and he reported feeling very well.  Today, he reports that his blood pressure is usually higher in the mornings at home such as 180s/85. In the afternoons his blood pressures will lower to the 140s/80s. He notes that 140-145/80-85 is the average for his at home blood pressures. In clinic today his blood pressure is 146/84. In the past, he was on metoprolol and losartan and didn't feel  well. Currently, he is off the losartan per his PCP. He denies experiencing any headaches or blurry vision with high blood pressures.  He was seen by orthopedics 07/2022, and was given a cortisone injection for right shoulder pain due to a torn rotator cuff. He states the treatment worked very well, but he soon developed dizziness with elevated blood pressure readings. He followed up with his PCP, who felt this was a side effect of cortisone. After 3 days his symptoms resolved and he has been feeling well since then.  To check his fluid retention, he will try to wrap his finger and thumb around his wrist. Usually he is able to do so in the afternoons, but in the mornings this feels a little tighter.  He denies any palpitations, chest pain, shortness of breath, lightheadedness, headaches, syncope, orthopnea, or PND.   Past Medical History:  Diagnosis Date   Hypertension     Past Surgical History:  Procedure Laterality Date   CYST EXCISION     DENTAL SURGERY     ROTATOR CUFF REPAIR Left    UMBILICAL HERNIA REPAIR      Current Medications: Current Outpatient Medications on File Prior to Visit  Medication Sig   metoprolol succinate (TOPROL-XL) 25 MG 24 hr tablet TAKE 1 TABLET(25 MG) BY MOUTH DAILY   No current facility-administered medications on file prior to visit.     Allergies:   Patient has no known allergies.   Social History  Tobacco Use   Smoking status: Former    Types: Cigarettes    Quit date: 07/01/1970    Years since quitting: 52.3   Smokeless tobacco: Never  Substance Use Topics   Alcohol use: Yes    Comment: "every now and then"   Drug use: Never    Family History: mother had CHF for about 2 years before she died, deceased age 79. Father had aneurysm vs. MI, died age 51, was a smoker. Mat Gma deceased age 60, unknown. Mat Gpa died age 41, MI. Griselda Miner died age 49 of Hodgkins. Audelia Hives died age 22, unknown cause. Two sisters, one deceased age 40 from colon cancer,  other sister deceased around age 13 with terminal lung cancer. Children are healthy.  ROS:   Please see the history of present illness.   Additional pertinent ROS otherwise unremarkable.     EKGs/Labs/Other Studies Reviewed:    The following studies were reviewed today:  Monitor  05/2021: Patch Wear Time:  6 days and 23 hours   Patient had a min HR of 41 bpm, max HR of 133 bpm, and avg HR of 58 bpm. Predominant underlying rhythm was Sinus Rhythm. 4 Supraventricular Tachycardia runs occurred, the run with the fastest interval lasting 7 beats with a max rate of 133 bpm, the longest lasting 10 beats with an avg rate of 102 bpm. Isolated atrial ectopy was rare (<1.0%). Isolated ventricular ectopy was frequent (9.7%). No VT, atrial fibrillation, high degree block, or pauses noted. There was 1 triggered event, which was sinus with PVC. There was variability with the daily PVC burden; burden higher 11/14-11/18/22, with up to 16.9% burden on 11/18. The remaining days were lower burden, down to <1% by end of study.  Monitor 07/02/20 ~3 days of data recorded on Zio monitor. Patient had a min HR of 49 bpm, max HR of 169 bpm, and avg HR of 66 bpm. Predominant underlying rhythm was Sinus Rhythm. No atrial fibrillation, high degree block, or pauses noted. Isolated atrial ectopy was rare (<1%), and isolated ventricular ectopy was frequent (28.9%). There was one 4 beat episode of NSVT, max rate 124 bpm. There were 7 episodes of SVT, longest lasting 16.2 seconds with an average rate of 107 bpm.  There were 0 triggered events.  Echo 05/24/2019  1. Left ventricular ejection fraction, by visual estimation, is 60 to  65%. The left ventricle has normal function. There is no left ventricular  hypertrophy.   2. Elevated left ventricular end-diastolic pressure.   3. Left ventricular diastolic parameters are consistent with Grade I  diastolic dysfunction (impaired relaxation).   4. Global right ventricle has normal  systolic function.The right  ventricular size is normal. No increase in right ventricular wall  thickness.   5. Left atrial size was normal.   6. Right atrial size was normal.   7. The mitral valve is normal in structure. Trace mitral valve  regurgitation. No evidence of mitral stenosis.   8. The tricuspid valve is normal in structure. Tricuspid valve  regurgitation is trivial.   9. The aortic valve is tricuspid. Aortic valve regurgitation is not  visualized. No evidence of aortic valve sclerosis or stenosis.  10. The pulmonic valve was normal in structure. Pulmonic valve  regurgitation is not visualized.  11. Normal pulmonary artery systolic pressure.  12. The inferior vena cava is dilated in size with >50% respiratory  variability, suggesting right atrial pressure of 8 mmHg.   EKG:  EKG is personally  reviewed.   11/05/2022: sinus bradycardia at 50 bpm 1st degree av block 05/14/2021: sinus bradycardia with 1st degree AV block, 51 bpm 05/16/20: sinus rhythm with first degree AV block, frequent PVCs in a pattern of bigeminy at 77 bpm  Recent Labs: No results found for requested labs within last 365 days.   Recent Lipid Panel No results found for: "CHOL", "TRIG", "HDL", "CHOLHDL", "VLDL", "LDLCALC", "LDLDIRECT"  Physical Exam:    VS:  BP (!) 146/84   Pulse (!) 50   Ht 5\' 9"  (1.753 m)   Wt 191 lb (86.6 kg)   BMI 28.21 kg/m     Wt Readings from Last 3 Encounters:  11/05/22 191 lb (86.6 kg)  07/02/22 190 lb (86.2 kg)  06/17/22 190 lb (86.2 kg)    GEN: Well nourished, well developed in no acute distress HEENT: Normal, moist mucous membranes NECK: No JVD CARDIAC: largely regular S1/S2 with only 1 premature beat, no rubs or gallops. 1/6 late systolic murmur. VASCULAR: Radial and DP pulses 2+ bilaterally. No carotid bruits RESPIRATORY:  Clear to auscultation without rales, wheezing or rhonchi  ABDOMEN: Soft, non-tender, non-distended MUSCULOSKELETAL:  Ambulates  independently SKIN: Warm and dry, no edema NEUROLOGIC:  Alert and oriented x 3. No focal neuro deficits noted. PSYCHIATRIC:  Normal affect   ASSESSMENT:    1. Frequent PVCs   2. Heart palpitations   3. Essential hypertension   4. Cardiac risk counseling   5. Drug-induced bradycardia     PLAN:    PVCs: -monitor with ~30% burden.  -metoprolol helped his symptoms -if symptoms worsen, limited options given his heart rate and blood pressure. Would consider EP referral  Murmur:  -echo reviewed, as above -prolapse not commented on, but on my review minimal MVP with trivial MR  Hypertension:  -above goal today, will restart losartan at low dose, watch for lows. Checks BMET in 3 weeks  Cardiac risk counseling and prevention recommendations: -recommend heart healthy/Mediterranean diet, with whole grains, fruits, vegetable, fish, lean meats, nuts, and olive oil. Limit salt. -recommend moderate walking, 3-5 times/week for 30-50 minutes each session. Aim for at least 150 minutes.week. Goal should be pace of 3 miles/hours, or walking 1.5 miles in 30 minutes -recommend avoidance of tobacco products. Avoid excess alcohol.  Plan for follow up: 12 months or sooner as needed.  Medication Adjustments/Labs and Tests Ordered: Current medicines are reviewed at length with the patient today.  Concerns regarding medicines are outlined above.   Orders Placed This Encounter  Procedures   EKG 12-Lead   Meds ordered this encounter  Medications   losartan (COZAAR) 25 MG tablet    Sig: Take 0.5 tablets (12.5 mg total) by mouth daily.    Dispense:  45 tablet    Refill:  3   Patient Instructions  We are going to restart losartan at a lower dose, and I would recommend taking in the evening to help with morning blood pressures. Goal is to average <130/80 most days. If you have low numbers, or lightheadedness/dizziness, please call and let me know.   I,Mathew Stumpf,acting as a Neurosurgeon for [employer], MD.,have documented all relevant documentation on the behalf of Jodelle Red, MD,as directed by  Jodelle Red, MD while in the presence of Jodelle Red, MD.  I, Jodelle Red, MD, have reviewed all documentation for this visit. The documentation on 11/05/22 for the exam, diagnosis, procedures, and orders are all accurate and complete.   Signed, Jodelle Red, MD PhD 11/05/2022  [facility]

## 2022-11-05 ENCOUNTER — Encounter (HOSPITAL_BASED_OUTPATIENT_CLINIC_OR_DEPARTMENT_OTHER): Payer: Self-pay | Admitting: Cardiology

## 2022-11-05 ENCOUNTER — Ambulatory Visit (HOSPITAL_BASED_OUTPATIENT_CLINIC_OR_DEPARTMENT_OTHER): Payer: Medicare Other | Admitting: Cardiology

## 2022-11-05 VITALS — BP 146/84 | HR 50 | Ht 69.0 in | Wt 191.0 lb

## 2022-11-05 DIAGNOSIS — Z7189 Other specified counseling: Secondary | ICD-10-CM

## 2022-11-05 DIAGNOSIS — T50905A Adverse effect of unspecified drugs, medicaments and biological substances, initial encounter: Secondary | ICD-10-CM | POA: Diagnosis not present

## 2022-11-05 DIAGNOSIS — I493 Ventricular premature depolarization: Secondary | ICD-10-CM

## 2022-11-05 DIAGNOSIS — R001 Bradycardia, unspecified: Secondary | ICD-10-CM | POA: Diagnosis not present

## 2022-11-05 DIAGNOSIS — I10 Essential (primary) hypertension: Secondary | ICD-10-CM

## 2022-11-05 DIAGNOSIS — R002 Palpitations: Secondary | ICD-10-CM | POA: Diagnosis not present

## 2022-11-05 MED ORDER — LOSARTAN POTASSIUM 25 MG PO TABS: 12.5000 mg | ORAL_TABLET | Freq: Every day | ORAL | 3 refills | Status: DC

## 2022-11-05 NOTE — Patient Instructions (Signed)
Medication Instructions:  We are going to restart losartan at a lower dose 12.5mg  (1/2 tablet) daily , and I would recommend taking in the evening to help with morning blood pressures. Goal is to average <130/80 most days. If you have low numbers, or lightheadedness/dizziness, please call and let me know. *If you need a refill on your cardiac medications before your next appointment, please call your pharmacy*   Lab Work: Please return for Lab work in 3 weeks for BMP. You may come to the...   [facility] (3rd floor) [address] [place], [place] [address]  Open: 8am-Noon and 1pm-4:30pm  Please ring the doorbell on the small table when you exit the elevator and the Lab Tech will come get you  [facility] at [facility] [address], [place], [place] [address] Open: 8am-1pm, then 2pm-4:30pm   [facility]- Please see attached locations sheet stapled to your lab work with address and hours.   If you have labs (blood work) drawn today and your tests are completely normal, you will receive your results only by: MyChart Message (if you have MyChart) OR A paper copy in the mail If you have any lab test that is abnormal or we need to change your treatment, we will call you to review the results.  Follow-Up: At [facility], you and your health needs are our priority.  As part of our continuing mission to provide you with exceptional heart care, we have created designated Provider Care Teams.  These Care Teams include your primary Cardiologist (physician) and Advanced Practice Providers (APPs -  Physician Assistants and Nurse Practitioners) who all work together to provide you with the care you need, when you need it.  We recommend signing up for the patient portal called "MyChart".  Sign up information is provided on this After Visit Summary.  MyChart is used to connect with patients for Virtual Visits (Telemedicine).  Patients are able to  view lab/test results, encounter notes, upcoming appointments, etc.  Non-urgent messages can be sent to your provider as well.   To learn more about what you can do with MyChart, go to [email or web address].    Your next appointment:   1 year(s)  Provider:   Jodelle Red, MD

## 2023-03-25 DIAGNOSIS — I10 Essential (primary) hypertension: Secondary | ICD-10-CM | POA: Diagnosis not present

## 2023-04-02 DIAGNOSIS — Z713 Dietary counseling and surveillance: Secondary | ICD-10-CM | POA: Diagnosis not present

## 2023-04-02 DIAGNOSIS — E785 Hyperlipidemia, unspecified: Secondary | ICD-10-CM | POA: Diagnosis not present

## 2023-04-02 DIAGNOSIS — Z79899 Other long term (current) drug therapy: Secondary | ICD-10-CM | POA: Diagnosis not present

## 2023-04-02 DIAGNOSIS — I10 Essential (primary) hypertension: Secondary | ICD-10-CM | POA: Diagnosis not present

## 2023-04-02 DIAGNOSIS — I493 Ventricular premature depolarization: Secondary | ICD-10-CM | POA: Diagnosis not present

## 2023-04-02 DIAGNOSIS — D696 Thrombocytopenia, unspecified: Secondary | ICD-10-CM | POA: Diagnosis not present

## 2023-04-02 DIAGNOSIS — Z23 Encounter for immunization: Secondary | ICD-10-CM | POA: Diagnosis not present

## 2023-06-28 ENCOUNTER — Other Ambulatory Visit: Payer: Self-pay | Admitting: Cardiology

## 2023-06-28 DIAGNOSIS — I493 Ventricular premature depolarization: Secondary | ICD-10-CM

## 2023-09-16 DIAGNOSIS — Z860101 Personal history of adenomatous and serrated colon polyps: Secondary | ICD-10-CM | POA: Diagnosis not present

## 2023-09-16 DIAGNOSIS — K648 Other hemorrhoids: Secondary | ICD-10-CM | POA: Diagnosis not present

## 2023-09-16 DIAGNOSIS — Z09 Encounter for follow-up examination after completed treatment for conditions other than malignant neoplasm: Secondary | ICD-10-CM | POA: Diagnosis not present

## 2023-09-16 DIAGNOSIS — D122 Benign neoplasm of ascending colon: Secondary | ICD-10-CM | POA: Diagnosis not present

## 2023-09-16 DIAGNOSIS — K5730 Diverticulosis of large intestine without perforation or abscess without bleeding: Secondary | ICD-10-CM | POA: Diagnosis not present

## 2023-09-18 DIAGNOSIS — D122 Benign neoplasm of ascending colon: Secondary | ICD-10-CM | POA: Diagnosis not present

## 2023-09-22 DIAGNOSIS — I10 Essential (primary) hypertension: Secondary | ICD-10-CM | POA: Diagnosis not present

## 2023-10-14 DIAGNOSIS — E785 Hyperlipidemia, unspecified: Secondary | ICD-10-CM | POA: Diagnosis not present

## 2023-10-14 DIAGNOSIS — Z0001 Encounter for general adult medical examination with abnormal findings: Secondary | ICD-10-CM | POA: Diagnosis not present

## 2023-10-14 DIAGNOSIS — I493 Ventricular premature depolarization: Secondary | ICD-10-CM | POA: Diagnosis not present

## 2023-10-14 DIAGNOSIS — I10 Essential (primary) hypertension: Secondary | ICD-10-CM | POA: Diagnosis not present

## 2023-10-14 DIAGNOSIS — M6530 Trigger finger, unspecified finger: Secondary | ICD-10-CM | POA: Diagnosis not present

## 2023-10-14 DIAGNOSIS — D696 Thrombocytopenia, unspecified: Secondary | ICD-10-CM | POA: Diagnosis not present

## 2023-10-31 ENCOUNTER — Other Ambulatory Visit (HOSPITAL_BASED_OUTPATIENT_CLINIC_OR_DEPARTMENT_OTHER): Payer: Self-pay | Admitting: Cardiology

## 2023-10-31 DIAGNOSIS — I10 Essential (primary) hypertension: Secondary | ICD-10-CM

## 2023-11-18 ENCOUNTER — Ambulatory Visit (HOSPITAL_BASED_OUTPATIENT_CLINIC_OR_DEPARTMENT_OTHER): Payer: Medicare Other | Admitting: Family

## 2023-11-18 ENCOUNTER — Encounter (HOSPITAL_BASED_OUTPATIENT_CLINIC_OR_DEPARTMENT_OTHER): Payer: Self-pay | Admitting: Family

## 2023-11-18 VITALS — BP 136/68 | HR 53 | Ht 69.0 in | Wt 192.4 lb

## 2023-11-18 DIAGNOSIS — I493 Ventricular premature depolarization: Secondary | ICD-10-CM

## 2023-11-18 DIAGNOSIS — I10 Essential (primary) hypertension: Secondary | ICD-10-CM | POA: Diagnosis not present

## 2023-11-18 DIAGNOSIS — E782 Mixed hyperlipidemia: Secondary | ICD-10-CM

## 2023-11-18 DIAGNOSIS — R002 Palpitations: Secondary | ICD-10-CM | POA: Diagnosis not present

## 2023-11-18 NOTE — Progress Notes (Signed)
 Cardiology Office Note:  .   Date:  11/18/2023  ID:  Dean Kaufman, DOB September 20, 1940, MRN [id number] PCP: Omie Bickers, MD  [facility] Providers Cardiologist:  Sheryle Donning, MD    History of Present Illness: .   Dean Kaufman is a 83 y.o. male with history of hypertension, MVP, PVC.  Monitor 05/2021 predominantly normal sinus rhythm conference SVT longest 10 beats with rate 102 bpm, PAC less than 1%, PVC 9.7%.  1 triggered event associate with PVC.  There is variability with PVC burden which was higher at beginning of the study 16.9% and later days less than 1%.  Last seen 11/05/2022 by Dr. Veryl Gottron.  Noted metoprolol  improved PVC symptoms.  Losartan  was resumed at low-dose for hypertension.  Presents today for follow up independently. Feeling well since last seen. Will check for PVCs with his radial pulse and notices they will change in frequency. They are not lifestyle limiting and not bothersome. Only checks because he is in the habit of doing so. Reports no shortness of breath nor dyspnea on exertion. Reports no chest pain, pressure, or tightness. No edema, orthopnea, PND. Reports no palpitations.  Exercising intermittently by walking but also doing his own yard work including push mowing. Drinking one pot of caffeinated coffee per day and does add salt to his food.   ROS: Please see the history of present illness.    All other systems reviewed and are negative.   Studies Reviewed: Aaron Aas   EKG Interpretation Date/Time:  Tuesday Nov 18 2023 08:12:27 EDT Ventricular Rate:  53 PR Interval:  282 QRS Duration:  98 QT Interval:  464 QTC Calculation: 435 R Axis:   20  Text Interpretation: Sinus bradycardia with 1st degree A-V block  No acute ST/T wave changes. Confirmed by Neomi Banks ([id number]) on 11/18/2023 8:18:43 AM    Cardiac Studies & Procedures   ______________________________________________________________________________________________      ECHOCARDIOGRAM  ECHOCARDIOGRAM COMPLETE 05/24/2019  Narrative ECHOCARDIOGRAM REPORT    Patient Name:   Dean Kaufman Date of Exam: 05/24/2019 Medical Rec #:  [id number]        Height:       69.0 in Accession #:    [id number]       Weight:       181.6 lb Date of Birth:  [birth year]         BSA:          1.98 m Patient Age:    78 years         BP:           136/82 mmHg Patient Gender: M                HR:           58 bpm. Exam Location:  Outpatient  Procedure: 2D Echo and Color Doppler  Indications:    Murmur  History:        Patient has no prior history of Echocardiogram examinations. Signs/Symptoms:Murmur; Risk Factors:Hypertension. Former smoker.  Sonographer:    Wellington Half RDCS Referring Phys: [id number] BRIDGETTE CHRISTOPHER  IMPRESSIONS   1. Left ventricular ejection fraction, by visual estimation, is 60 to 65%. The left ventricle has normal function. There is no left ventricular hypertrophy. 2. Elevated left ventricular end-diastolic pressure. 3. Left ventricular diastolic parameters are consistent with Grade I diastolic dysfunction (impaired relaxation). 4. Global right ventricle has normal systolic function.The right ventricular size is normal. No increase in right ventricular wall thickness.  5. Left atrial size was normal. 6. Right atrial size was normal. 7. The mitral valve is normal in structure. Trace mitral valve regurgitation. No evidence of mitral stenosis. 8. The tricuspid valve is normal in structure. Tricuspid valve regurgitation is trivial. 9. The aortic valve is tricuspid. Aortic valve regurgitation is not visualized. No evidence of aortic valve sclerosis or stenosis. 10. The pulmonic valve was normal in structure. Pulmonic valve regurgitation is not visualized. 11. Normal pulmonary artery systolic pressure. 12. The inferior vena cava is dilated in size with >50% respiratory variability, suggesting right atrial pressure of 8 mmHg.  FINDINGS Left  Ventricle: Left ventricular ejection fraction, by visual estimation, is 60 to 65%. The left ventricle has normal function. There is no left ventricular hypertrophy. Left ventricular diastolic parameters are consistent with Grade I diastolic dysfunction (impaired relaxation). Elevated left ventricular end-diastolic pressure.  Right Ventricle: The right ventricular size is normal. No increase in right ventricular wall thickness. Global RV systolic function is has normal systolic function. The tricuspid regurgitant velocity is 1.62 m/s, and with an assumed right atrial pressure of 8 mmHg, the estimated right ventricular systolic pressure is normal at 18.5 mmHg.  Left Atrium: Left atrial size was normal in size.  Right Atrium: Right atrial size was normal in size  Pericardium: There is no evidence of pericardial effusion.  Mitral Valve: The mitral valve is normal in structure. No evidence of mitral valve stenosis by observation. Trace mitral valve regurgitation.  Tricuspid Valve: The tricuspid valve is normal in structure. Tricuspid valve regurgitation is trivial.  Aortic Valve: The aortic valve is tricuspid. Aortic valve regurgitation is not visualized. The aortic valve is structurally normal, with no evidence of sclerosis or stenosis.  Pulmonic Valve: The pulmonic valve was normal in structure. Pulmonic valve regurgitation is not visualized.  Aorta: The aortic root, ascending aorta and aortic arch are all structurally normal, with no evidence of dilitation or obstruction.  Venous: The inferior vena cava is dilated in size with greater than 50% respiratory variability, suggesting right atrial pressure of 8 mmHg.  IAS/Shunts: No atrial level shunt detected by color flow Doppler. No ventricular septal defect is seen or detected. There is no evidence of an atrial septal defect.    LEFT VENTRICLE PLAX 2D LVIDd:         4.95 cm  Diastology LVIDs:         2.80 cm  LV e' lateral:   3.70 cm/s LV  PW:         1.08 cm  LV E/e' lateral: 17.2 LV IVS:        0.96 cm  LV e' medial:    3.59 cm/s LVOT diam:     2.00 cm  LV E/e' medial:  17.7 LV SV:         86 ml LV SV Index:   42.62 LVOT Area:     3.14 cm   RIGHT VENTRICLE RV S prime:     13.70 cm/s  LEFT ATRIUM             Index       RIGHT ATRIUM           Index LA diam:        4.50 cm 2.27 cm/m  RA Area:     11.10 cm LA Vol (A2C):   37.8 ml 19.06 ml/m RA Volume:   26.40 ml  13.31 ml/m LA Vol (A4C):   32.6 ml 16.44 ml/m LA Biplane Vol: 35.5  ml 17.90 ml/m AORTIC VALVE LVOT Vmax:   118.00 cm/s LVOT Vmean:  67.800 cm/s LVOT VTI:    0.264 m  AORTA Ao Root diam: 3.70 cm  MITRAL VALVE                        TRICUSPID VALVE MV Area (PHT): 1.67 cm             TR Peak grad:   10.5 mmHg MV PHT:        131.66 msec          TR Vmax:        162.00 cm/s MV Decel Time: 454 msec MV E velocity: 63.50 cm/s 103 cm/s  SHUNTS MV A velocity: 64.50 cm/s 70.3 cm/s Systemic VTI:  0.26 m MV E/A ratio:  0.98       1.5       Systemic Diam: 2.00 cm   Maudine Sos MD Electronically signed by Maudine Sos MD Signature Date/Time: 05/24/2019/10:53:46 AM    Final    MONITORS  LONG TERM MONITOR (3-14 DAYS) 05/28/2021  Narrative Patch Wear Time:  6 days and 23 hours  Patient had a min HR of 41 bpm, max HR of 133 bpm, and avg HR of 58 bpm. Predominant underlying rhythm was Sinus Rhythm. 4 Supraventricular Tachycardia runs occurred, the run with the fastest interval lasting 7 beats with a max rate of 133 bpm, the longest lasting 10 beats with an avg rate of 102 bpm. Isolated atrial ectopy was rare (<1.0%). Isolated ventricular ectopy was frequent (9.7%). No VT, atrial fibrillation, high degree block, or pauses noted. There was 1 triggered event, which was sinus with PVC. There was variability with the daily PVC burden; burden higher 11/14-11/18/22, with up to 16.9% burden on 11/18. The remaining days were lower burden, down to <1% by end  of study.       ______________________________________________________________________________________________      Risk Assessment/Calculations:             Physical Exam:   VS:  BP 136/68 (BP Location: Right Arm, Patient Position: Sitting, Cuff Size: Normal)   Pulse (!) 53   Ht 5\' 9"  (1.753 m)   Wt 192 lb 6.4 oz (87.3 kg)   SpO2 95%   BMI 28.41 kg/m    Wt Readings from Last 3 Encounters:  11/18/23 192 lb 6.4 oz (87.3 kg)  11/05/22 191 lb (86.6 kg)  07/02/22 190 lb (86.2 kg)    GEN: Well nourished, well developed in no acute distress NECK: No JVD; No carotid bruits CARDIAC: RRR, no murmurs, rubs, gallops RESPIRATORY:  Clear to auscultation without rales, wheezing or rhonchi  ABDOMEN: Soft, non-tender, non-distended EXTREMITIES:  No edema; No deformity   ASSESSMENT AND PLAN: .    HTN - BP mildly elevated. Plan for lifestyle changes: lowering caffeine and sodium intake. Recommend aiming for 150 minutes of moderate intensity activity per week and following a heart healthy diet.  follow up with PCP as scheduled in the fall, if BP persistently >130/80, consider increasing losartan  from 12.5mg  to 25mg .  PVC - Asymptomatic. Continue Toprol  25mg  daily.  Sinus bradycardia - asymptomatic with no lightheadedness, dizziness. Continue Toprol  25mg  daily for PVC.  MVP - mild MR by echo 05/2019. No heart failure symptoms, no indication for repeat echocardiogram. Continue optimal BP control, as above.   HLD - 09/22/23 total choleterol 192, HDL 42, LDL 121, triglycerides 162. Recommend lifestyle changes to lower cholesterol,  discussed in clinic.        Dispo: follow up in 1 year  Signed, Clearnce Curia, NP

## 2023-11-18 NOTE — Patient Instructions (Signed)
 Medication Instructions:   Continue your current medications   *If you need a refill on your cardiac medications before your next appointment, please call your pharmacy*  Lab Work: Your cholesterol in March was a bit high.   Your other labs looked good!  Testing/Procedures: Your EKG today showed sinus braduycardia which is normal but slightly slow heart rhythm.  Follow-Up: At [facility], you and your health needs are our priority.  As part of our continuing mission to provide you with exceptional heart care, our providers are all part of one team.  This team includes your primary Cardiologist (physician) and Advanced Practice Providers or APPs (Physician Assistants and Nurse Practitioners) who all work together to provide you with the care you need, when you need it.  Your next appointment:   1 year(s)  Provider:   Sheryle Donning, MD, Slater Duncan, NP, or Neomi Banks, NP    We recommend signing up for the patient portal called "MyChart".  Sign up information is provided on this After Visit Summary.  MyChart is used to connect with patients for Virtual Visits (Telemedicine).  Patients are able to view lab/test results, encounter notes, upcoming appointments, etc.  Non-urgent messages can be sent to your provider as well.   To learn more about what you can do with MyChart, go to [email or web address].   Other Instructions  To prevent palpitations (PVC): Make sure you are adequately hydrated.  Avoid and/or limit caffeine containing beverages like soda or tea. Exercise regularly.  Manage stress well. Some over the counter medications can cause palpitations such as Benadryl, AdvilPM, TylenolPM. Regular Advil or Tylenol  do not cause palpitations.

## 2024-01-03 ENCOUNTER — Other Ambulatory Visit: Payer: Self-pay | Admitting: Cardiology

## 2024-01-03 DIAGNOSIS — I493 Ventricular premature depolarization: Secondary | ICD-10-CM

## 2024-04-06 DIAGNOSIS — I10 Essential (primary) hypertension: Secondary | ICD-10-CM | POA: Diagnosis not present

## 2024-04-13 DIAGNOSIS — D696 Thrombocytopenia, unspecified: Secondary | ICD-10-CM | POA: Diagnosis not present

## 2024-04-13 DIAGNOSIS — I10 Essential (primary) hypertension: Secondary | ICD-10-CM | POA: Diagnosis not present

## 2024-04-13 DIAGNOSIS — Z87891 Personal history of nicotine dependence: Secondary | ICD-10-CM | POA: Diagnosis not present

## 2024-04-13 DIAGNOSIS — Z713 Dietary counseling and surveillance: Secondary | ICD-10-CM | POA: Diagnosis not present

## 2024-04-13 DIAGNOSIS — M6530 Trigger finger, unspecified finger: Secondary | ICD-10-CM | POA: Diagnosis not present

## 2024-04-13 DIAGNOSIS — M25511 Pain in right shoulder: Secondary | ICD-10-CM | POA: Diagnosis not present

## 2024-04-13 DIAGNOSIS — I493 Ventricular premature depolarization: Secondary | ICD-10-CM | POA: Diagnosis not present

## 2024-04-13 DIAGNOSIS — Z23 Encounter for immunization: Secondary | ICD-10-CM | POA: Diagnosis not present

## 2024-04-13 DIAGNOSIS — E785 Hyperlipidemia, unspecified: Secondary | ICD-10-CM | POA: Diagnosis not present
